# Patient Record
Sex: Female | Born: 1955 | Race: White | Hispanic: No | Marital: Married | Smoking: Current every day smoker
Health system: Southern US, Community
[De-identification: ages and names within clinical notes are randomized; demographics above are authoritative.]

## PROBLEM LIST (undated history)

## (undated) DIAGNOSIS — K219 Gastro-esophageal reflux disease without esophagitis: Secondary | ICD-10-CM

## (undated) DIAGNOSIS — F329 Major depressive disorder, single episode, unspecified: Secondary | ICD-10-CM

## (undated) DIAGNOSIS — F32A Depression, unspecified: Secondary | ICD-10-CM

## (undated) DIAGNOSIS — J449 Chronic obstructive pulmonary disease, unspecified: Secondary | ICD-10-CM

## (undated) DIAGNOSIS — M1990 Unspecified osteoarthritis, unspecified site: Secondary | ICD-10-CM

## (undated) DIAGNOSIS — M5030 Other cervical disc degeneration, unspecified cervical region: Secondary | ICD-10-CM

## (undated) DIAGNOSIS — M5432 Sciatica, left side: Secondary | ICD-10-CM

## (undated) DIAGNOSIS — I10 Essential (primary) hypertension: Secondary | ICD-10-CM

## (undated) DIAGNOSIS — R519 Headache, unspecified: Secondary | ICD-10-CM

## (undated) DIAGNOSIS — J309 Allergic rhinitis, unspecified: Secondary | ICD-10-CM

## (undated) DIAGNOSIS — E119 Type 2 diabetes mellitus without complications: Secondary | ICD-10-CM

## (undated) DIAGNOSIS — E785 Hyperlipidemia, unspecified: Secondary | ICD-10-CM

## (undated) DIAGNOSIS — Z720 Tobacco use: Secondary | ICD-10-CM

## (undated) HISTORY — DX: Unspecified osteoarthritis, unspecified site: M19.90

## (undated) HISTORY — DX: Type 2 diabetes mellitus without complications: E11.9

## (undated) HISTORY — DX: Gastro-esophageal reflux disease without esophagitis: K21.9

## (undated) HISTORY — DX: Major depressive disorder, single episode, unspecified: F32.9

## (undated) HISTORY — PX: EYE SURGERY: SHX253

## (undated) HISTORY — PX: CHOLECYSTECTOMY: SHX55

## (undated) HISTORY — PX: TONSILLECTOMY: SUR1361

## (undated) HISTORY — PX: CATARACT EXTRACTION: SUR2

## (undated) HISTORY — PX: CARPAL TUNNEL RELEASE: SHX101

## (undated) HISTORY — DX: Hyperlipidemia, unspecified: E78.5

## (undated) HISTORY — DX: Other cervical disc degeneration, unspecified cervical region: M50.30

## (undated) HISTORY — PX: OTHER SURGICAL HISTORY: SHX169

## (undated) HISTORY — DX: Sciatica, left side: M54.32

## (undated) HISTORY — DX: Depression, unspecified: F32.A

## (undated) HISTORY — DX: Essential (primary) hypertension: I10

## (undated) HISTORY — DX: Allergic rhinitis, unspecified: J30.9

## (undated) HISTORY — DX: Tobacco use: Z72.0

---

## 2000-02-05 HISTORY — PX: OTHER SURGICAL HISTORY: SHX169

## 2018-09-19 ENCOUNTER — Encounter: Payer: Self-pay | Admitting: *Deleted

## 2018-09-19 NOTE — Addendum Note (Signed)
Addended by: Eustace Moore on: 09/19/2018 04:42 PM   Modules accepted: Orders

## 2018-09-20 ENCOUNTER — Telehealth: Payer: Self-pay | Admitting: Cardiology

## 2018-09-20 ENCOUNTER — Ambulatory Visit: Payer: Managed Care, Other (non HMO) | Admitting: Cardiology

## 2018-09-20 ENCOUNTER — Encounter: Payer: Self-pay | Admitting: *Deleted

## 2018-09-20 ENCOUNTER — Encounter: Payer: Self-pay | Admitting: Cardiology

## 2018-09-20 VITALS — BP 127/74 | HR 85 | Ht 64.5 in | Wt 136.2 lb

## 2018-09-20 DIAGNOSIS — R002 Palpitations: Secondary | ICD-10-CM | POA: Diagnosis not present

## 2018-09-20 DIAGNOSIS — R079 Chest pain, unspecified: Secondary | ICD-10-CM

## 2018-09-20 NOTE — Progress Notes (Signed)
Clinical Summary Ms. Zarcone is a 64 y.o.female seen today as a new consult, referred by Dr Sherril Croon for chest pain and palpitations.   1. Chest pain - admission 07/2018 to [facility] with chest pain - negative cardiac enzymes, EKG without acute ischemic changes. CXR no acute process, did have some aortic atherosclerosis - 01/2017 echo LVEF >75%, possible diastolic dysfunction - 08/2018 echo LVEF 70-75%, no WMAs, abnormal diastolic function   - tightness in chest midchest, 7-8/10 in severity. +SOB. Some palpitations. Pain radiating to both shoulders - not positional. Pain lasted 12 hours constant - mild improvement with NG   - no recurrent tightness. Mild SOB at times  CAD risk factors: DM2, HTN, hyperlipidemia, +smoker just under 50 years, father MI in his early 56s, mother history of heart attacks and stents she thinks  - cannot run on treadmill due to leg pains.   2. Palpitations - 1-2 times per week. Lasts a few minutes. Can occur at any time. No other associated symptoms - coffee x 2-4 cups, occasional tea, no sodas, no energy drinks, no EtOH.   Past Medical History:  Diagnosis Date  . Allergic rhinitis   . Arthritis   . DDD (degenerative disc disease), cervical   . Depression   . GERD (gastroesophageal reflux disease)   . Hyperlipidemia   . Hypertension   . Sciatica of left side   . Tobacco abuse   . Type 2 diabetes mellitus (HCC)      No Known Allergies   Current Outpatient Medications  Medication Sig Dispense Refill  . aspirin 325 MG tablet Take 325 mg by mouth daily.    . Calcium-Magnesium-Zinc 333-133-5 MG TABS Take 1 tablet by mouth daily.    . dapagliflozin propanediol (FARXIGA) 10 MG TABS tablet Take 10 mg by mouth daily.    . diclofenac (CATAFLAM) 50 MG tablet Take 50 mg by mouth 2 (two) times daily.    . Dulaglutide (TRULICITY) 0.75 MG/0.5ML SOPN Inject 0.75 mg into the skin once a week.    . enalapril (VASOTEC) 5 MG tablet Take 5 mg by mouth  daily.    Marland Kitchen Fe Cbn-Fe Gluc-FA-B12-C-DSS (FERRALET 90 PO) Take 1 tablet by mouth daily.    . fenofibrate 160 MG tablet Take 160 mg by mouth daily.    Marland Kitchen gabapentin (NEURONTIN) 600 MG tablet Take 600 mg by mouth 2 (two) times daily.    Marland Kitchen glipiZIDE (GLUCOTROL) 10 MG tablet Take 5 mg by mouth daily before breakfast. & 10 mg in the evening    . hydrochlorothiazide (HYDRODIURIL) 25 MG tablet Take 25 mg by mouth daily.    . lansoprazole (PREVACID) 30 MG capsule Take 30 mg by mouth daily at 12 noon.    . metFORMIN (GLUCOPHAGE) 1000 MG tablet Take 1,000 mg by mouth 2 (two) times daily with a meal.    . niacin 500 MG tablet Take 1,000 mg by mouth at bedtime.     . sertraline (ZOLOFT) 100 MG tablet Take 100 mg by mouth daily.    . simvastatin (ZOCOR) 40 MG tablet Take 40 mg by mouth daily.    . vitamin E 400 UNIT capsule Take 800 Units by mouth daily.     No current facility-administered medications for this visit.         No Known Allergies    Family History  Problem Relation Age of Onset  . Heart disease Mother   . Hypertension Mother   . Kidney disease  Mother   . Hyperlipidemia Mother   . Heart disease Father   . Diabetes Father      Social History Ms. Mceuen reports that she has been smoking. She does not have any smokeless tobacco history on file. Ms. Felderman has no history on file for alcohol.   Review of Systems CONSTITUTIONAL: No weight loss, fever, chills, weakness or fatigue.  HEENT: Eyes: No visual loss, blurred vision, double vision or yellow sclerae.No hearing loss, sneezing, congestion, runny nose or sore throat.  SKIN: No rash or itching.  CARDIOVASCULAR: per hpi RESPIRATORY:per hpi GASTROINTESTINAL: No anorexia, nausea, vomiting or diarrhea. No abdominal pain or blood.  GENITOURINARY: No burning on urination, no polyuria NEUROLOGICAL: No headache, dizziness, syncope, paralysis, ataxia, numbness or tingling in the extremities. No change in bowel or bladder control.   MUSCULOSKELETAL: No muscle, back pain, joint pain or stiffness.  LYMPHATICS: No enlarged nodes. No history of splenectomy.  PSYCHIATRIC: No history of depression or anxiety.  ENDOCRINOLOGIC: No reports of sweating, cold or heat intolerance. No polyuria or polydipsia.  Marland Kitchen   Physical Examination Vitals:   09/20/18 1320 09/20/18 1326  BP: 101/64 127/74  Pulse: 83 85  SpO2: 96% 95%   Vitals:   09/20/18 1320  Weight: 136 lb 3.2 oz (61.8 kg)  Height: 5' 4.5" (1.638 m)    Gen: resting comfortably, no acute distress HEENT: no scleral icterus, pupils equal round and reactive, no palptable cervical adenopathy,  CV: RRR, 2/6 systolic murmur rusb, no jvd Resp: Clear to auscultation bilaterally GI: abdomen is soft, non-tender, non-distended, normal bowel sounds, no hepatosplenomegaly MSK: extremities are warm, no edema.  Skin: warm, no rash Neuro:  no focal deficits Psych: appropriate affect     Assessment and Plan  1. Chest pain - mixed symptoms in description, several CAD risk factors as well as some aortic atherosclerosis by imaging - plan for a lexiscan to further evaluate - continue ASA for now, if negative stress test would not be an indication for further use. Would lower to 81mg  daily if she continues long term   2. Palpitations - complete ischemic workup as outlined above first. She is to wean caffeien, if ongoing symptoms plan for a 1 week event monitor   F/u 2 months      Antoine Poche, M.D

## 2018-09-20 NOTE — Patient Instructions (Signed)
Your physician recommends that you schedule a follow-up appointment in: 2 MONTHS WITH DR [facility]  Your physician recommends that you continue on your current medications as directed. Please refer to the Current Medication list given to you today.  Your physician has requested that you have a lexiscan myoview. For further information please visit [email or web address] Please follow instruction sheet, as given.  Thank you for choosing [facility]!!

## 2018-09-20 NOTE — Telephone Encounter (Signed)
Pre-cert Verification for the following procedure   Lexiscan myoview scheduled for 09-29-2018 at [facility]

## 2018-09-29 ENCOUNTER — Encounter (HOSPITAL_BASED_OUTPATIENT_CLINIC_OR_DEPARTMENT_OTHER)
Admission: RE | Admit: 2018-09-29 | Discharge: 2018-09-29 | Disposition: A | Discharge status: Home / Self Care | Payer: Managed Care, Other (non HMO) | Source: Ambulatory Visit | Attending: Cardiology | Admitting: Cardiology

## 2018-09-29 ENCOUNTER — Encounter (HOSPITAL_COMMUNITY)
Admission: RE | Admit: 2018-09-29 | Discharge: 2018-09-29 | Disposition: A | Discharge status: Home / Self Care | Payer: Managed Care, Other (non HMO) | Source: Ambulatory Visit | Attending: Cardiology | Admitting: Cardiology

## 2018-09-29 ENCOUNTER — Encounter (HOSPITAL_COMMUNITY): Payer: Self-pay

## 2018-09-29 DIAGNOSIS — R079 Chest pain, unspecified: Secondary | ICD-10-CM | POA: Diagnosis not present

## 2018-09-29 LAB — NM MYOCAR MULTI W/SPECT W/WALL MOTION / EF
LV dias vol: 79 mL (ref 46–106)
LV sys vol: 25 mL
Peak HR: 100 {beats}/min
RATE: 0.21
Rest HR: 64 {beats}/min
SDS: 1
SRS: 0
SSS: 1
TID: 1.06

## 2018-09-29 MED ORDER — REGADENOSON 0.4 MG/5ML IV SOLN
INTRAVENOUS | Status: AC
  Administered 2018-09-29: 0.4 mg via INTRAVENOUS
  Filled 2018-09-29: qty 5

## 2018-09-29 MED ORDER — SODIUM CHLORIDE 0.9% FLUSH
INTRAVENOUS | Status: AC
  Administered 2018-09-29: 10 mL via INTRAVENOUS
  Filled 2018-09-29: qty 10

## 2018-09-29 MED ORDER — TECHNETIUM TC 99M TETROFOSMIN IV KIT
10.0000 | PACK | Freq: Once | INTRAVENOUS | Status: AC | PRN
  Administered 2018-09-29: 9.6 via INTRAVENOUS

## 2018-09-29 MED ORDER — TECHNETIUM TC 99M TETROFOSMIN IV KIT
30.0000 | PACK | Freq: Once | INTRAVENOUS | Status: AC | PRN
  Administered 2018-09-29: 32.3 via INTRAVENOUS

## 2018-10-04 ENCOUNTER — Telehealth: Payer: Self-pay | Admitting: *Deleted

## 2018-10-04 NOTE — Telephone Encounter (Signed)
-----   Message from Antoine Poche, MD sent at 10/02/2018  4:25 PM EST ----- Stress test overall looks good, no evidence of significant blockages. Ok to stop aspirin  Dominga Ferry MD

## 2018-10-04 NOTE — Telephone Encounter (Signed)
Pt voiced understanding - updated medication list  - routed to pcp  

## 2018-11-21 ENCOUNTER — Ambulatory Visit: Payer: Managed Care, Other (non HMO) | Admitting: Cardiology

## 2018-12-12 ENCOUNTER — Telehealth: Payer: Self-pay | Admitting: *Deleted

## 2018-12-12 NOTE — Telephone Encounter (Signed)
   Primary Cardiologist:  No primary care provider on file.   Patient contacted.  History reviewed.  No symptoms to suggest any unstable cardiac conditions.  Based on discussion, with current pandemic situation, we will be postponing this appointment for Eddie Dibbles with a plan for f/u June 2020 or sooner if feasible/necessary.  If symptoms change, she has been instructed to contact our office.  Patient said she isn't having any issues at this time and will call back and schedule an appointment if she has anymore problems.  Thalia Bloodgood, RN  12/12/2018 9:37 AM         .

## 2018-12-15 ENCOUNTER — Ambulatory Visit: Payer: Managed Care, Other (non HMO) | Admitting: Cardiology

## 2020-07-14 ENCOUNTER — Other Ambulatory Visit: Payer: Self-pay | Admitting: Neurosurgery

## 2020-07-14 DIAGNOSIS — M5416 Radiculopathy, lumbar region: Secondary | ICD-10-CM

## 2020-07-22 ENCOUNTER — Ambulatory Visit
Admission: RE | Admit: 2020-07-22 | Discharge: 2020-07-22 | Disposition: A | Discharge status: Home / Self Care | Payer: Managed Care, Other (non HMO) | Source: Ambulatory Visit | Attending: Neurosurgery | Admitting: Neurosurgery

## 2020-07-22 ENCOUNTER — Other Ambulatory Visit: Payer: Self-pay

## 2020-07-22 DIAGNOSIS — M5416 Radiculopathy, lumbar region: Secondary | ICD-10-CM

## 2021-08-21 IMAGING — CT CT L SPINE W/O CM
1 of 7 series · 5 of 14 positions shown, 7 images · non-contrast
Comparison: 05/15/2020

CLINICAL DATA: Low back pain with radiculopathy affecting both
lower extremities, worsening. Symptoms began 4 months ago

EXAM:
CT LUMBAR SPINE WITHOUT CONTRAST
TECHNIQUE: Multidetector CT imaging of the lumbar spine was performed without
intravenous contrast administration. Multiplanar CT image
reconstructions were also generated.

[Series 3: l spine soft · axial · 0.29mm/px · z∈[-253,-109]mm · 5 of 72 slices shown, 7 images]
[im 12/72  soft-tissue]
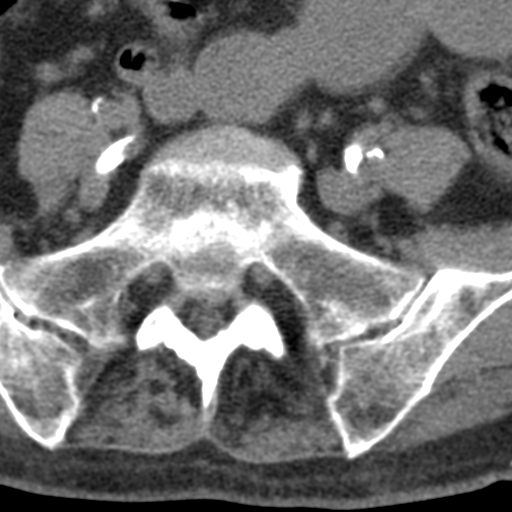
[im 12/72  bone]
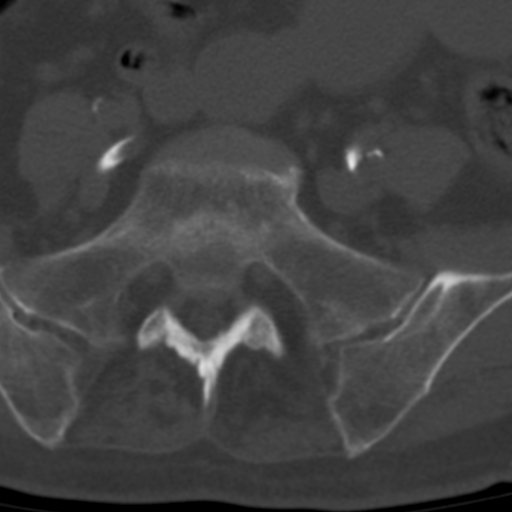
[im 24/72  bone]
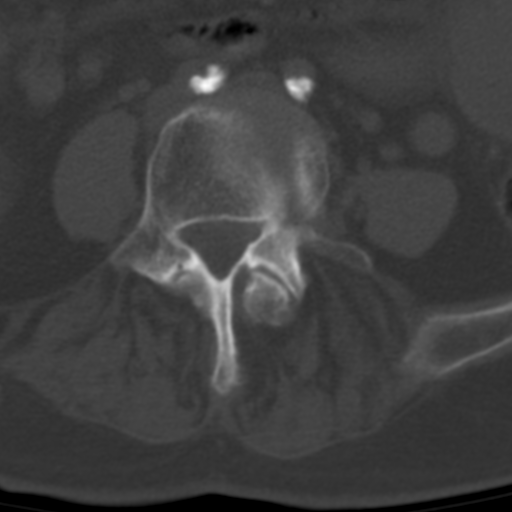
[im 36/72  bone]
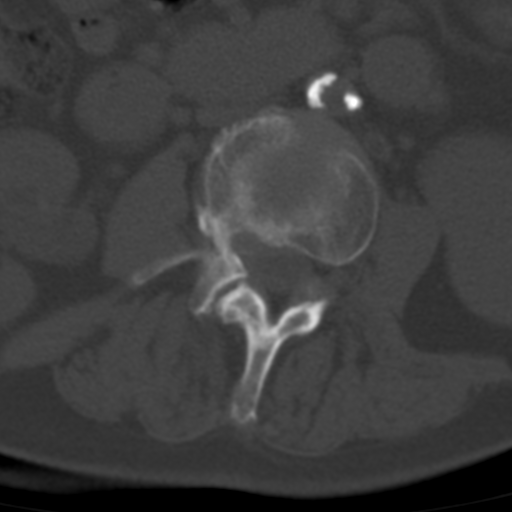
[im 48/72  bone]
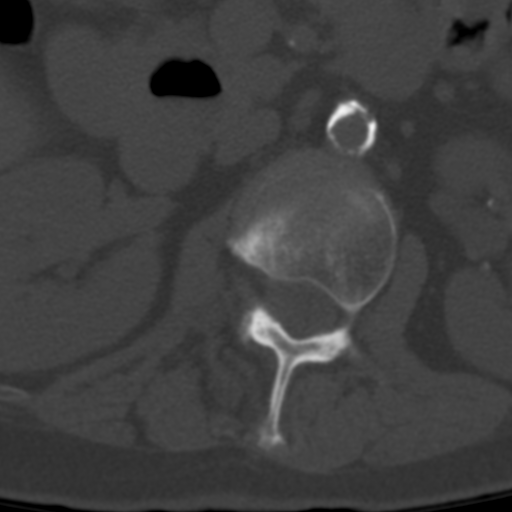
[im 60/72  soft-tissue]
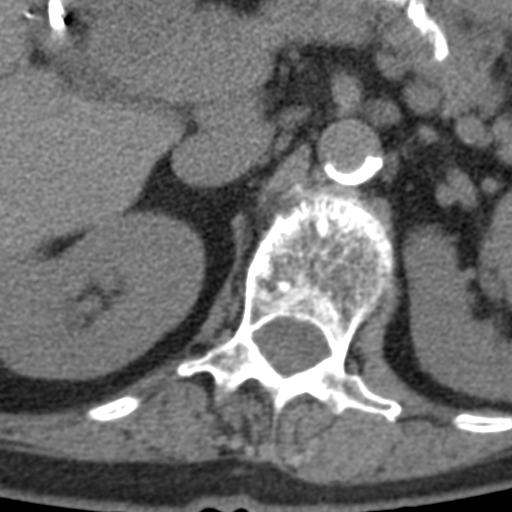
[im 60/72  bone]
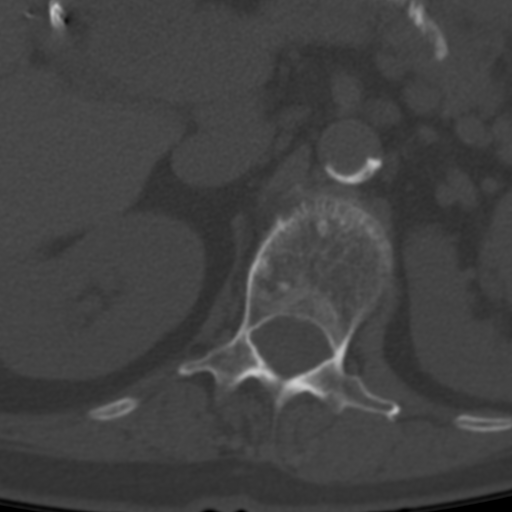

[5 of 14 positions shown; findings below may reference images not displayed]

FINDINGS: Segmentation: 5 lumbar type vertebrae

Alignment: Levoscoliosis centered at L2-3. Cobb angle would be
better measured on standing radiograph. Rightward L5-S1 translation.

Vertebrae: No acute fracture or focal pathologic process.

Paraspinal and other soft tissues: Diffuse atheromatous
calcification.

Disc levels:

T12- L1: Disc narrowing and endplate spurring. No neural
compression.

L1-L2: Advanced rightward disc space narrowing with right
preferential bulging. Mild to moderate right foraminal narrowing,
mild by MRI. Patent spinal canal.

L2-L3: Mild disc narrowing and left preferential bulging. No neural
compression

L3-L4: Disc narrowing and bulging with endplate and facet spurring
asymmetric to the right where there is moderate or advanced
foraminal impingement. Mild to moderate spinal stenosis.

L4-L5: Facet degeneration with asymmetric left-sided spurring. Disc
narrowing and bulging. Moderate left foraminal narrowing.
Mild-to-moderate spinal stenosis.

L5-S1:Asymmetric leftward disc narrowing and endplate/facet
spurring. Moderate left foraminal narrowing.
IMPRESSION: 1. Multilevel disc and facet degeneration with prominent
levoscoliosis.
2. L1-2 and L3-4 right foraminal stenosis.
3. L4-5 and L5-S1 left foraminal stenosis.
4. Mild to moderate spinal stenosis at L3-4.

## 2022-12-30 ENCOUNTER — Ambulatory Visit (HOSPITAL_COMMUNITY): Payer: Self-pay | Admitting: Emergency Medicine

## 2022-12-30 DIAGNOSIS — G8929 Other chronic pain: Secondary | ICD-10-CM

## 2022-12-30 NOTE — H&P (Signed)
TOTAL HIP ADMISSION H&P  Patient is admitted for left total hip arthroplasty.  Subjective:  Chief Complaint: left hip pain  HPI: Kylie Knight, 67 y.o. female, has a history of pain and functional disability in the left hip(s) due to arthritis and patient has failed non-surgical conservative treatments for greater than 12 weeks to include NSAID's and/or analgesics, corticosteriod injections, supervised PT with diminished ADL's post treatment, use of assistive devices, and activity modification.  Onset of symptoms was gradual starting 2 years ago with gradually worsening course since that time.The patient noted no past surgery on the bilaterally hip(s).  Patient currently rates pain in the left hip at 10 out of 10 with activity. Patient has night pain, worsening of pain with activity and weight bearing, pain that interfers with activities of daily living, and pain with passive range of motion. Patient has evidence of periarticular osteophytes and joint space narrowing by imaging studies. This condition presents safety issues increasing the risk of falls.  There is no current active infection.  There are no problems to display for this patient.  Past Medical History:  Diagnosis Date   Allergic rhinitis    Arthritis    DDD (degenerative disc disease), cervical    Depression    GERD (gastroesophageal reflux disease)    Hyperlipidemia    Hypertension    Sciatica of left side    Tobacco abuse    Type 2 diabetes mellitus (HCC)     Past Surgical History:  Procedure Laterality Date   CARPAL TUNNEL RELEASE Bilateral    CATARACT EXTRACTION     CESAREAN SECTION     x's 2   CHOLECYSTECTOMY     REVISION TOTAL DISC ARTHROPLASTY, CERVICAL, SINGLE     TONSILLECTOMY      Current Outpatient Medications  Medication Sig Dispense Refill Last Dose   Calcium-Magnesium-Zinc 333-133-5 MG TABS Take 1 tablet by mouth daily.      Cholecalciferol (VITAMIN D3) 125 MCG (5000 UT) CAPS Take 1 capsule by  mouth daily.      dapagliflozin propanediol (FARXIGA) 10 MG TABS tablet Take 10 mg by mouth daily.      Dulaglutide (TRULICITY) 0.75 MG/0.5ML SOPN Inject 0.75 mg into the skin once a week.      enalapril (VASOTEC) 5 MG tablet Take 5 mg by mouth daily.      fenofibrate 160 MG tablet Take 160 mg by mouth daily.      gabapentin (NEURONTIN) 600 MG tablet Take 600 mg by mouth 2 (two) times daily.      glipiZIDE (GLUCOTROL) 10 MG tablet Take 5 mg by mouth daily before breakfast. & 10 mg in the evening      hydrochlorothiazide (HYDRODIURIL) 25 MG tablet Take 25 mg by mouth daily.      lansoprazole (PREVACID) 30 MG capsule Take 30 mg by mouth daily at 12 noon.      loratadine (CLARITIN) 10 MG tablet Take 10 mg by mouth daily.      metFORMIN (GLUCOPHAGE) 1000 MG tablet Take 1,000 mg by mouth 2 (two) times daily with a meal.      Multiple Vitamin (MULTIVITAMIN) tablet Take 1 tablet by mouth daily.      niacin 500 MG tablet Take 1,000 mg by mouth at bedtime.       sertraline (ZOLOFT) 100 MG tablet Take 100 mg by mouth daily.      simvastatin (ZOCOR) 40 MG tablet Take 40 mg by mouth daily.      vitamin  E 400 UNIT capsule Take 400 Units by mouth daily.       No current facility-administered medications for this visit.   No Known Allergies  Social History   Tobacco Use   Smoking status: Every Day    Types: Cigarettes    Start date: 12/22/1969   Smokeless tobacco: Never  Substance Use Topics   Alcohol use: Not on file    Family History  Problem Relation Age of Onset   Heart disease Mother    Hypertension Mother    Kidney disease Mother    Hyperlipidemia Mother    Heart disease Father    Diabetes Father      Review of Systems  Musculoskeletal:  Positive for arthralgias.  All other systems reviewed and are negative.   Objective:  Physical Exam Constitutional:      General: She is not in acute distress.    Appearance: Normal appearance. She is not ill-appearing.  HENT:     Head:  Normocephalic and atraumatic.     Right Ear: External ear normal.     Left Ear: External ear normal.     Nose: Nose normal.     Mouth/Throat:     Mouth: Mucous membranes are moist.     Pharynx: Oropharynx is clear.  Eyes:     Extraocular Movements: Extraocular movements intact.     Conjunctiva/sclera: Conjunctivae normal.  Cardiovascular:     Rate and Rhythm: Normal rate and regular rhythm.     Pulses: Normal pulses.     Heart sounds: Normal heart sounds.  Pulmonary:     Effort: Pulmonary effort is normal.     Breath sounds: Normal breath sounds.  Abdominal:     General: Bowel sounds are normal.     Palpations: Abdomen is soft.     Tenderness: There is no abdominal tenderness.  Musculoskeletal:        General: Tenderness present.     Cervical back: Normal range of motion and neck supple.     Comments: LEFT HIP: TTP over lateral aspect and groin area.  Painful ROM.  No lesions on area of chief complaint.  BLE appear grossly neurovascularly intact.  Mild decreased strength of LLE.  Mildly antalgic gait.  Skin:    General: Skin is warm and dry.  Neurological:     Mental Status: She is alert and oriented to person, place, and time. Mental status is at baseline.  Psychiatric:        Mood and Affect: Mood normal.        Behavior: Behavior normal.     Vital signs in last 24 hours: @VSRANGES @  Labs:   Estimated body mass index is 23.02 kg/m as calculated from the following:   Height as of 09/20/18: 5' 4.5" (1.638 m).   Weight as of 09/20/18: 61.8 kg.   Imaging Review Plain radiographs demonstrate severe degenerative joint disease of the left hip(s). The bone quality appears to be good for age and reported activity level.      Assessment/Plan:  End stage arthritis, left hip(s)  The patient history, physical examination, clinical judgement of the provider and imaging studies are consistent with end stage degenerative joint disease of the left hip(s) and total hip  arthroplasty is deemed medically necessary. The treatment options including medical management, injection therapy, arthroscopy and arthroplasty were discussed at length. The risks and benefits of total hip arthroplasty were presented and reviewed. The risks due to aseptic loosening, infection, stiffness, dislocation/subluxation,  thromboembolic complications  and other imponderables were discussed.  The patient acknowledged the explanation, agreed to proceed with the plan and consent was signed. Patient is being admitted for inpatient treatment for surgery, pain control, PT, OT, prophylactic antibiotics, VTE prophylaxis, progressive ambulation and ADL's and discharge planning.The patient is planning to be discharged  home with outpatient PT.

## 2022-12-30 NOTE — H&P (View-Only) (Signed)
TOTAL HIP ADMISSION H&P  Patient is admitted for left total hip arthroplasty.  Subjective:  Chief Complaint: left hip pain  HPI: Kylie Knight, 67 y.o. female, has a history of pain and functional disability in the left hip(s) due to arthritis and patient has failed non-surgical conservative treatments for greater than 12 weeks to include NSAID's and/or analgesics, corticosteriod injections, supervised PT with diminished ADL's post treatment, use of assistive devices, and activity modification.  Onset of symptoms was gradual starting 2 years ago with gradually worsening course since that time.The patient noted no past surgery on the bilaterally hip(s).  Patient currently rates pain in the left hip at 10 out of 10 with activity. Patient has night pain, worsening of pain with activity and weight bearing, pain that interfers with activities of daily living, and pain with passive range of motion. Patient has evidence of periarticular osteophytes and joint space narrowing by imaging studies. This condition presents safety issues increasing the risk of falls.  There is no current active infection.  There are no problems to display for this patient.  Past Medical History:  Diagnosis Date   Allergic rhinitis    Arthritis    DDD (degenerative disc disease), cervical    Depression    GERD (gastroesophageal reflux disease)    Hyperlipidemia    Hypertension    Sciatica of left side    Tobacco abuse    Type 2 diabetes mellitus (HCC)     Past Surgical History:  Procedure Laterality Date   CARPAL TUNNEL RELEASE Bilateral    CATARACT EXTRACTION     CESAREAN SECTION     x's 2   CHOLECYSTECTOMY     REVISION TOTAL DISC ARTHROPLASTY, CERVICAL, SINGLE     TONSILLECTOMY      Current Outpatient Medications  Medication Sig Dispense Refill Last Dose   Calcium-Magnesium-Zinc 333-133-5 MG TABS Take 1 tablet by mouth daily.      Cholecalciferol (VITAMIN D3) 125 MCG (5000 UT) CAPS Take 1 capsule by  mouth daily.      dapagliflozin propanediol (FARXIGA) 10 MG TABS tablet Take 10 mg by mouth daily.      Dulaglutide (TRULICITY) 0.75 MG/0.5ML SOPN Inject 0.75 mg into the skin once a week.      enalapril (VASOTEC) 5 MG tablet Take 5 mg by mouth daily.      fenofibrate 160 MG tablet Take 160 mg by mouth daily.      gabapentin (NEURONTIN) 600 MG tablet Take 600 mg by mouth 2 (two) times daily.      glipiZIDE (GLUCOTROL) 10 MG tablet Take 5 mg by mouth daily before breakfast. & 10 mg in the evening      hydrochlorothiazide (HYDRODIURIL) 25 MG tablet Take 25 mg by mouth daily.      lansoprazole (PREVACID) 30 MG capsule Take 30 mg by mouth daily at 12 noon.      loratadine (CLARITIN) 10 MG tablet Take 10 mg by mouth daily.      metFORMIN (GLUCOPHAGE) 1000 MG tablet Take 1,000 mg by mouth 2 (two) times daily with a meal.      Multiple Vitamin (MULTIVITAMIN) tablet Take 1 tablet by mouth daily.      niacin 500 MG tablet Take 1,000 mg by mouth at bedtime.       sertraline (ZOLOFT) 100 MG tablet Take 100 mg by mouth daily.      simvastatin (ZOCOR) 40 MG tablet Take 40 mg by mouth daily.      vitamin   E 400 UNIT capsule Take 400 Units by mouth daily.       No current facility-administered medications for this visit.   No Known Allergies  Social History   Tobacco Use   Smoking status: Every Day    Types: Cigarettes    Start date: 12/22/1969   Smokeless tobacco: Never  Substance Use Topics   Alcohol use: Not on file    Family History  Problem Relation Age of Onset   Heart disease Mother    Hypertension Mother    Kidney disease Mother    Hyperlipidemia Mother    Heart disease Father    Diabetes Father      Review of Systems  Musculoskeletal:  Positive for arthralgias.  All other systems reviewed and are negative.   Objective:  Physical Exam Constitutional:      General: She is not in acute distress.    Appearance: Normal appearance. She is not ill-appearing.  HENT:     Head:  Normocephalic and atraumatic.     Right Ear: External ear normal.     Left Ear: External ear normal.     Nose: Nose normal.     Mouth/Throat:     Mouth: Mucous membranes are moist.     Pharynx: Oropharynx is clear.  Eyes:     Extraocular Movements: Extraocular movements intact.     Conjunctiva/sclera: Conjunctivae normal.  Cardiovascular:     Rate and Rhythm: Normal rate and regular rhythm.     Pulses: Normal pulses.     Heart sounds: Normal heart sounds.  Pulmonary:     Effort: Pulmonary effort is normal.     Breath sounds: Normal breath sounds.  Abdominal:     General: Bowel sounds are normal.     Palpations: Abdomen is soft.     Tenderness: There is no abdominal tenderness.  Musculoskeletal:        General: Tenderness present.     Cervical back: Normal range of motion and neck supple.     Comments: LEFT HIP: TTP over lateral aspect and groin area.  Painful ROM.  No lesions on area of chief complaint.  BLE appear grossly neurovascularly intact.  Mild decreased strength of LLE.  Mildly antalgic gait.  Skin:    General: Skin is warm and dry.  Neurological:     Mental Status: She is alert and oriented to person, place, and time. Mental status is at baseline.  Psychiatric:        Mood and Affect: Mood normal.        Behavior: Behavior normal.     Vital signs in last 24 hours: @VSRANGES@  Labs:   Estimated body mass index is 23.02 kg/m as calculated from the following:   Height as of 09/20/18: 5' 4.5" (1.638 m).   Weight as of 09/20/18: 61.8 kg.   Imaging Review Plain radiographs demonstrate severe degenerative joint disease of the left hip(s). The bone quality appears to be good for age and reported activity level.      Assessment/Plan:  End stage arthritis, left hip(s)  The patient history, physical examination, clinical judgement of the provider and imaging studies are consistent with end stage degenerative joint disease of the left hip(s) and total hip  arthroplasty is deemed medically necessary. The treatment options including medical management, injection therapy, arthroscopy and arthroplasty were discussed at length. The risks and benefits of total hip arthroplasty were presented and reviewed. The risks due to aseptic loosening, infection, stiffness, dislocation/subluxation,  thromboembolic complications   and other imponderables were discussed.  The patient acknowledged the explanation, agreed to proceed with the plan and consent was signed. Patient is being admitted for inpatient treatment for surgery, pain control, PT, OT, prophylactic antibiotics, VTE prophylaxis, progressive ambulation and ADL's and discharge planning.The patient is planning to be discharged  home with outpatient PT.   

## 2023-01-13 NOTE — Patient Instructions (Signed)
SURGICAL WAITING ROOM VISITATION Patients having surgery or a procedure may have no more than 2 support people in the waiting area - these visitors may rotate in the visitor waiting room.   Due to an increase in RSV and influenza rates and associated hospitalizations, children ages 7 and under may not visit patients in [facility] hospitals. If the patient needs to stay at the hospital during part of their recovery, the visitor guidelines for inpatient rooms apply.  PRE-OP VISITATION  Pre-op nurse will coordinate an appropriate time for 1 support person to accompany the patient in pre-op.  This support person may not rotate.  This visitor will be contacted when the time is appropriate for the visitor to come back in the pre-op area.  Please refer to the [facility] website for the visitor guidelines for Inpatients (after your surgery is over and you are in a regular room).  You are not required to quarantine at this time prior to your surgery. However, you must do this: Hand Hygiene often Do NOT share personal items Notify your provider if you are in close contact with someone who has COVID or you develop fever 100.4 or greater, new onset of sneezing, cough, sore throat, shortness of breath or body aches.  If you test positive for Covid or have been in contact with anyone that has tested positive in the last 10 days please notify you surgeon.    Your procedure is scheduled on:  Wednesday  Jan 26, 2023  Report to [facility] Main Entrance: Leota Jacobsen entrance where the [employer] is available.   Report to admitting at: 07:45    AM  +++++Call this number if you have any questions or problems the morning of surgery [phone]  Do not eat food after Midnight the night prior to your surgery/procedure.  After Midnight you may have the following liquids until  07:15  AM  DAY OF SURGERY  Clear Liquid Diet Water Black Coffee (sugar ok, NO MILK/CREAM OR CREAMERS)  Tea (sugar ok, NO  MILK/CREAM OR CREAMERS) regular and decaf                             Plain Jell-O  with no fruit (NO RED)                                           Fruit ices (not with fruit pulp, NO RED)                                     Popsicles (NO RED)                                                                  Juice: apple, WHITE grape, WHITE cranberry Sports drinks like Gatorade or Powerade (NO RED)                     The day of surgery:  Drink ONE (1) Pre-Surgery G2 at   07:15  AM the morning of surgery.  Drink in one sitting. Do not sip.  This drink was given to you during your hospital pre-op appointment visit. Nothing else to drink after completing the Pre-Surgery  G2 : No candy, chewing gum or throat lozenges.    FOLLOW  ANY ADDITIONAL PRE OP INSTRUCTIONS YOU RECEIVED FROM YOUR SURGEON'S OFFICE!!!   Oral Hygiene is also important to reduce your risk of infection.        Remember - BRUSH YOUR TEETH THE MORNING OF SURGERY WITH YOUR REGULAR TOOTHPASTE  Do NOT smoke after Midnight the night before surgery.  DIABETIC Medication:  TRULICITY Injection- Do not inject for 7 days prior to your surgery;  Last injection will be on :  FARXIGA: Hold 72 hours before your surgery. METFORMIN: DO NOT Take the day of your surgery, GLIPIZIDE:  DO NOT Take the day of your surgery.  Take ONLY these medicines the morning of surgery with A SIP OF WATER: fenofibrate, loratadine (Claritin), sertraline (Zoloft), gabapentin.                     You may not have any metal on your body including hair pins, jewelry, and body piercing  Do not wear make-up, lotions, powders, perfumes  or deodorant  Do not wear nail polish including gel and S&S, artificial / acrylic nails, or any other type of covering on natural nails including finger and toenails. If you have artificial nails, gel coating, etc., that needs to be removed by a nail salon, Please have this removed prior to surgery. Not doing so may mean that  your surgery could be cancelled or delayed if the Surgeon or anesthesia staff feels like they are unable to monitor you safely.   Do not shave 48 hours prior to surgery to avoid nicks in your skin which may contribute to postoperative infections.   Contacts, Hearing Aids, dentures or bridgework may not be worn into surgery. DENTURES WILL BE REMOVED PRIOR TO SURGERY PLEASE DO NOT APPLY "Poly grip" OR ADHESIVES!!!   Patients discharged on the day of surgery will not be allowed to drive home.  Someone NEEDS to stay with you for the first 24 hours after anesthesia.  Do not bring your home medications to the hospital. The Pharmacy will dispense medications listed on your medication list to you during your admission in the Hospital.   Please read over the following fact sheets you were given: IF YOU HAVE QUESTIONS ABOUT YOUR PRE-OP INSTRUCTIONS, PLEASE CALL [phone].   +++++++ PLEASE FOLLOW THE ATTACHED INFORMATION REGARDING SHOWERING / BATHING SCHEDULE  PRIOR TO YOUR SURGERY. Start this schedule on :  Saturday  Jan 22, 2023    ON THE DAY OF SURGERY : Do not apply any lotions/deodorants the morning of surgery.  Please wear clean clothes to the hospital/surgery center.    FAILURE TO FOLLOW THESE INSTRUCTIONS MAY RESULT IN THE CANCELLATION OF YOUR SURGERY  PATIENT SIGNATURE_________________________________  NURSE SIGNATURE__________________________________  ________________________________________________________________________       Rogelia Mire    An incentive spirometer is a tool that can help keep your lungs clear and active. This tool measures how well you are filling your lungs with each breath. Taking long deep breaths may help reverse or decrease the chance of developing breathing (pulmonary) problems (especially infection) following: A long period of time when you are unable to move or be active. BEFORE THE PROCEDURE  If the spirometer includes an indicator to  show your best effort, your nurse or respiratory therapist will  set it to a desired goal. If possible, sit up straight or lean slightly forward. Try not to slouch. Hold the incentive spirometer in an upright position. INSTRUCTIONS FOR USE  Sit on the edge of your bed if possible, or sit up as far as you can in bed or on a chair. Hold the incentive spirometer in an upright position. Breathe out normally. Place the mouthpiece in your mouth and seal your lips tightly around it. Breathe in slowly and as deeply as possible, raising the piston or the ball toward the top of the column. Hold your breath for 3-5 seconds or for as long as possible. Allow the piston or ball to fall to the bottom of the column. Remove the mouthpiece from your mouth and breathe out normally. Rest for a few seconds and repeat Steps 1 through 7 at least 10 times every 1-2 hours when you are awake. Take your time and take a few normal breaths between deep breaths. The spirometer may include an indicator to show your best effort. Use the indicator as a goal to work toward during each repetition. After each set of 10 deep breaths, practice coughing to be sure your lungs are clear. If you have an incision (the cut made at the time of surgery), support your incision when coughing by placing a pillow or rolled up towels firmly against it. Once you are able to get out of bed, walk around indoors and cough well. You may stop using the incentive spirometer when instructed by your caregiver.  RISKS AND COMPLICATIONS Take your time so you do not get dizzy or light-headed. If you are in pain, you may need to take or ask for pain medication before doing incentive spirometry. It is harder to take a deep breath if you are having pain. AFTER USE Rest and breathe slowly and easily. It can be helpful to keep track of a log of your progress. Your caregiver can provide you with a simple table to help with this. If you are using the spirometer at  home, follow these instructions: SEEK MEDICAL CARE IF:  You are having difficultly using the spirometer. You have trouble using the spirometer as often as instructed. Your pain medication is not giving enough relief while using the spirometer. You develop fever of 100.5 F (38.1 C) or higher.                                                                                                    SEEK IMMEDIATE MEDICAL CARE IF:  You cough up bloody sputum that had not been present before. You develop fever of 102 F (38.9 C) or greater. You develop worsening pain at or near the incision site. MAKE SURE YOU:  Understand these instructions. Will watch your condition. Will get help right away if you are not doing well or get worse. Document Released: 01/03/2007 Document Revised: 11/15/2011 Document Reviewed: 03/06/2007 [facility] Patient Information 2014 [place], [place].     WHAT IS A BLOOD TRANSFUSION? Blood Transfusion Information  A transfusion is the replacement of blood or some of its parts.  Blood is made up of multiple cells which provide different functions. Red blood cells carry oxygen and are used for blood loss replacement. White blood cells fight against infection. Platelets control bleeding. Plasma helps clot blood. Other blood products are available for specialized needs, such as hemophilia or other clotting disorders. BEFORE THE TRANSFUSION  Who gives blood for transfusions?  Healthy volunteers who are fully evaluated to make sure their blood is safe. This is blood bank blood. Transfusion therapy is the safest it has ever been in the practice of medicine. Before blood is taken from a donor, a complete history is taken to make sure that person has no history of diseases nor engages in risky social behavior (examples are intravenous drug use or sexual activity with multiple partners). The donor's travel history is screened to minimize risk of transmitting infections, such as malaria.  The donated blood is tested for signs of infectious diseases, such as HIV and hepatitis. The blood is then tested to be sure it is compatible with you in order to minimize the chance of a transfusion reaction. If you or a relative donates blood, this is often done in anticipation of surgery and is not appropriate for emergency situations. It takes many days to process the donated blood. RISKS AND COMPLICATIONS Although transfusion therapy is very safe and saves many lives, the main dangers of transfusion include:  Getting an infectious disease. Developing a transfusion reaction. This is an allergic reaction to something in the blood you were given. Every precaution is taken to prevent this. The decision to have a blood transfusion has been considered carefully by your caregiver before blood is given. Blood is not given unless the benefits outweigh the risks. AFTER THE TRANSFUSION Right after receiving a blood transfusion, you will usually feel much better and more energetic. This is especially true if your red blood cells have gotten low (anemic). The transfusion raises the level of the red blood cells which carry oxygen, and this usually causes an energy increase. The nurse administering the transfusion will monitor you carefully for complications. HOME CARE INSTRUCTIONS  No special instructions are needed after a transfusion. You may find your energy is better. Speak with your caregiver about any limitations on activity for underlying diseases you may have. SEEK MEDICAL CARE IF:  Your condition is not improving after your transfusion. You develop redness or irritation at the intravenous (IV) site. SEEK IMMEDIATE MEDICAL CARE IF:  Any of the following symptoms occur over the next 12 hours: Shaking chills. You have a temperature by mouth above 102 F (38.9 C), not controlled by medicine. Chest, back, or muscle pain. People around you feel you are not acting correctly or are confused. Shortness  of breath or difficulty breathing. Dizziness and fainting. You get a rash or develop hives. You have a decrease in urine output. Your urine turns a dark color or changes to pink, red, or brown. Any of the following symptoms occur over the next 10 days: You have a temperature by mouth above 102 F (38.9 C), not controlled by medicine. Shortness of breath. Weakness after normal activity. The white part of the eye turns yellow (jaundice). You have a decrease in the amount of urine or are urinating less often. Your urine turns a dark color or changes to pink, red, or brown. Document Released: 08/20/2000 Document Revised: 11/15/2011 Document Reviewed: 04/08/2008 [facility] Patient Information 2014 [place], [place].  _______________________________________________________________________

## 2023-01-13 NOTE — Progress Notes (Addendum)
COVID Vaccine received:  []  No [x]  Yes Date of any COVID positive Test in last 90 days:  None  PCP - Doreen Beam, MD , sees Orvilla Cornwall, NP Cardiologist - Nanetta Batty, MD  (LOV 09-20-2018 for CP, palps) Pain Mgmt/ Neurosurgery-  Celedonio Miyamoto, PA-C at [facility] [phone]  Chest x-ray -  EKG -  07-31-2018  Epic  will repeat at PST Stress Test - Myoview 09-29-2018  Epic ECHO - 08-14-2018  Epic  for palpitations Cardiac Cath -   PCR screen: [x]  Ordered & Completed           []   No Order but Needs PROFEND           []   N/A for this surgery  Surgery Plan:  [x]  Ambulatory                            []  Outpatient in bed                            []  Admit  Anesthesia:    []  General  [x]  Spinal                           []   Choice []   MAC  Pacemaker / ICD device [x]  No []  Yes   Spinal Cord Stimulator:[]  No [x]  Yes  Patient instructed to bring remote on DOS.      History of Sleep Apnea? [x]  No []  Yes   CPAP used?- [x]  No []  Yes    Does the patient monitor blood sugar?          []  No [x]  Yes  []  N/A Last A1c- 12-22-2022 ? 6.5 Patient has: []  NO Hx DM   []  Pre-DM                 []  DM1  [x]   DM2 Does patient have a [employer] or Dexacom? [x]  No []  Yes   Fasting Blood Sugar Ranges- 115-130 Checks Blood Sugar _2-3  times a day  GLP1 agonist / usual dose - Truliciy (Dulaglutide),  GLP1 instructions: weekly injection on Saturday,.  Last injection: 01-15-2023  Saturday SGLT-2 inhibitors / usual dose - Farxiga 10 mg daily.    SGLT-2 instructions: hold 72 hours. Last Dose: 01-22-2023  Saturday Other Diabetic medications/ instructions: Metformin 1000mg  q am - Hold DOS.  Glipizide 10mg  q am- Hold DOS Patient voiced understanding of these instructions.  Blood Thinner / Instructions: none Aspirin Instructions: none  ERAS Protocol Ordered: []  No  [x]  Yes PRE-SURGERY []  ENSURE  [x]  G2  Patient is to be NPO after: 07:15  Comments: Patient lives in [place],  [place]  Patient was given the 5 CHG shower / bath instructions for THA surgery along with 2 bottles of the CHG soap. Patient will start this on:   Saturday  Jan 22, 2023.   Patient voiced understanding of this process.   Activity level: Patient is able to climb a flight of stairs without difficulty; [x]  No CP but would have SOB.  Patient can perform ADLs without assistance.   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  Patient verbalized understanding and agreement to the Pre-Surgical Instructions that  were given to them at this PAT appointment. Patient was also educated of the need to review these PAT instructions again prior to her surgery.I reviewed the appropriate phone numbers to call if they have any and questions or concerns.

## 2023-01-14 ENCOUNTER — Encounter (HOSPITAL_COMMUNITY): Payer: Self-pay | Admitting: *Deleted

## 2023-01-14 ENCOUNTER — Encounter (HOSPITAL_COMMUNITY)
Admission: RE | Admit: 2023-01-14 | Discharge: 2023-01-14 | Disposition: A | Discharge status: Home / Self Care | Payer: BC Managed Care – PPO | Source: Ambulatory Visit | Attending: Orthopedic Surgery | Admitting: Orthopedic Surgery

## 2023-01-14 ENCOUNTER — Other Ambulatory Visit: Payer: Self-pay

## 2023-01-14 VITALS — BP 110/55 | HR 86 | Temp 98.9°F | Resp 14 | Ht 64.0 in | Wt 119.0 lb

## 2023-01-14 DIAGNOSIS — I10 Essential (primary) hypertension: Secondary | ICD-10-CM | POA: Diagnosis not present

## 2023-01-14 DIAGNOSIS — F17200 Nicotine dependence, unspecified, uncomplicated: Secondary | ICD-10-CM | POA: Insufficient documentation

## 2023-01-14 DIAGNOSIS — G8929 Other chronic pain: Secondary | ICD-10-CM

## 2023-01-14 DIAGNOSIS — K219 Gastro-esophageal reflux disease without esophagitis: Secondary | ICD-10-CM | POA: Diagnosis not present

## 2023-01-14 DIAGNOSIS — J449 Chronic obstructive pulmonary disease, unspecified: Secondary | ICD-10-CM | POA: Insufficient documentation

## 2023-01-14 DIAGNOSIS — E119 Type 2 diabetes mellitus without complications: Secondary | ICD-10-CM | POA: Diagnosis not present

## 2023-01-14 DIAGNOSIS — Z01818 Encounter for other preprocedural examination: Secondary | ICD-10-CM | POA: Insufficient documentation

## 2023-01-14 DIAGNOSIS — M1612 Unilateral primary osteoarthritis, left hip: Secondary | ICD-10-CM | POA: Insufficient documentation

## 2023-01-14 DIAGNOSIS — Z7984 Long term (current) use of oral hypoglycemic drugs: Secondary | ICD-10-CM | POA: Diagnosis not present

## 2023-01-14 DIAGNOSIS — G894 Chronic pain syndrome: Secondary | ICD-10-CM | POA: Insufficient documentation

## 2023-01-14 HISTORY — DX: Headache, unspecified: R51.9

## 2023-01-14 HISTORY — DX: Chronic obstructive pulmonary disease, unspecified: J44.9

## 2023-01-14 LAB — COMPREHENSIVE METABOLIC PANEL
ALT: 13 U/L (ref 0–44)
AST: 19 U/L (ref 15–41)
Albumin: 4.8 g/dL (ref 3.5–5.0)
Alkaline Phosphatase: 31 U/L — ABNORMAL LOW (ref 38–126)
Anion gap: 11 (ref 5–15)
BUN: 11 mg/dL (ref 8–23)
CO2: 25 mmol/L (ref 22–32)
Calcium: 9.6 mg/dL (ref 8.9–10.3)
Chloride: 101 mmol/L (ref 98–111)
Creatinine, Ser: 0.49 mg/dL (ref 0.44–1.00)
GFR, Estimated: >60 mL/min (ref >60)
Glucose, Bld: 126 mg/dL — ABNORMAL HIGH (ref 70–99)
Potassium: 4.1 mmol/L (ref 3.5–5.1)
Sodium: 137 mmol/L (ref 135–145)
Total Bilirubin: 0.3 mg/dL (ref 0.3–1.2)
Total Protein: 7.4 g/dL (ref 6.5–8.1)

## 2023-01-14 LAB — CBC WITH DIFFERENTIAL/PLATELET
Abs Immature Granulocytes: 0.03 10*3/uL (ref 0.00–0.07)
Basophils Absolute: 0.1 10*3/uL (ref 0.0–0.1)
Basophils Relative: 1 %
Eosinophils Absolute: 0.1 10*3/uL (ref 0.0–0.5)
Eosinophils Relative: 1 %
HCT: 44.4 % (ref 36.0–46.0)
Hemoglobin: 14.9 g/dL (ref 12.0–15.0)
Immature Granulocytes: 0 %
Lymphocytes Relative: 24 %
Lymphs Abs: 2.9 10*3/uL (ref 0.7–4.0)
MCH: 30.7 pg (ref 26.0–34.0)
MCHC: 33.6 g/dL (ref 30.0–36.0)
MCV: 91.5 fL (ref 80.0–100.0)
Monocytes Absolute: 1.1 10*3/uL — ABNORMAL HIGH (ref 0.1–1.0)
Monocytes Relative: 8 %
Neutro Abs: 8.3 10*3/uL — ABNORMAL HIGH (ref 1.7–7.7)
Neutrophils Relative %: 66 %
Platelets: 261 10*3/uL (ref 150–400)
RBC: 4.85 MIL/uL (ref 3.87–5.11)
RDW: 13.5 % (ref 11.5–15.5)
WBC: 12.5 10*3/uL — ABNORMAL HIGH (ref 4.0–10.5)
nRBC: 0 % (ref 0.0–0.2)

## 2023-01-14 LAB — TYPE AND SCREEN
ABO/RH(D): AB POS
Antibody Screen: NEGATIVE

## 2023-01-14 LAB — SURGICAL PCR SCREEN
MRSA, PCR: NEGATIVE
Staphylococcus aureus: NEGATIVE

## 2023-01-14 LAB — GLUCOSE, CAPILLARY: Glucose-Capillary: 131 mg/dL — ABNORMAL HIGH (ref 70–99)

## 2023-01-18 ENCOUNTER — Encounter (HOSPITAL_COMMUNITY): Payer: Self-pay

## 2023-01-18 NOTE — Progress Notes (Signed)
Case: [id number] Date/Time: 01/26/23 0932   Procedure: TOTAL HIP ARTHROPLASTY (Left: Hip)   Anesthesia type: Spinal   Pre-op diagnosis: OA LEFT HIP   Location: [facility] ROOM 08 / [facility]   Surgeons: Joen Laura, MD       DISCUSSION: Kylie Knight is a 67 year old female who presents to PAT clinic prior to surgery listed above.  Past medical history significant for diabetes, hypertension, current smoking, COPD, GERD, chronic pain syndrome on chronic narcotics with spinal cord stimulator.  No prior anesthesia complications.  Patient follows with her PCP for her diabetes and hypertension.  Her last A1c was 6.5 on 12/22/2022. Blood pressure has been controlled. Medical clearance provided in chart that patient is optimized from a medical and cardiac standpoint.   VS: BP (!) 110/55 Comment: right arm sitting  Pulse 86   Temp 37.2 C (Oral)   Resp 14   Ht 5\' 4"  (1.626 m)   Wt 54 kg   SpO2 95%   BMI 20.43 kg/m   PROVIDERS: Ignatius Specking, MD   LABS: Labs reviewed: Acceptable for surgery. (all labs ordered are listed, but only abnormal results are displayed)  Labs Reviewed  CBC WITH DIFFERENTIAL/PLATELET - Abnormal; Notable for the following components:      Result Value   WBC 12.5 (*)    Neutro Abs 8.3 (*)    Monocytes Absolute 1.1 (*)    All other components within normal limits  COMPREHENSIVE METABOLIC PANEL - Abnormal; Notable for the following components:   Glucose, Bld 126 (*)    Alkaline Phosphatase 31 (*)    All other components within normal limits  GLUCOSE, CAPILLARY - Abnormal; Notable for the following components:   Glucose-Capillary 131 (*)    All other components within normal limits  SURGICAL PCR SCREEN  TYPE AND SCREEN     IMAGES: n/a   EKG 01/14/23:  NSR   CV:  Echo 08/14/2018:  Findings: Left ventricle-the estimated LV ejection fraction is normal at 70 to 75%.  LV chamber size is normal.  LV wall thickness is mildly increased.  There are no  wall motion abnormalities observed.  There is Doppler evidence for left ventricular diastolic dysfunction. Right ventricle: RV chamber and wall dimensions are normal. Left atrium: LA chamber size is normal.  LA diameter is 3.89 cm.  The LA volume index is 21.37 mL/meters squared.  The LA volume index is within normal limits. Right atrium: RA chamber size is normal. Aortic valve: There is a trileaflet aortic valve.  There is no aortic valve stenosis or regurgitation.  The aortic valve leaflet excursion is normal Mitral valve: There is normal mitral valve structure and function.  There is no mitral valve stenosis or regurgitation. Tricuspid valve there is mild tricuspid regurgitation.  There is no tricuspid stenosis.  The estimated RV systolic pressure is 20-30.  Right atrial pressure is 5 mmHg Pulmonary valve: There is minimal pulmonic regurgitation.  There is no pulmonic stenosis. Pericardium: The pericardium is normal. Aorta: The size of the visualized portion of the aortic root is within normal limits.  They are aortic root diameter is 2.7 cm. Venous: The inferior vena cava dimension is normal.    NM Stress test 09/29/2018:  Narrative & Impression  There was no ST segment deviation noted during stress. This is a low risk study. The left ventricular ejection fraction is hyperdynamic (>65%). Small mild intensity reversible apical defect likely due to differences in adjacent gut radiotracer uptake, cannot exclude mild  apical ischemia. Either findings supports low risk      Past Medical History:  Diagnosis Date   Allergic rhinitis    Arthritis    COPD (chronic obstructive pulmonary disease) (HCC)    DDD (degenerative disc disease), cervical    Depression    GERD (gastroesophageal reflux disease)    Headache    Hyperlipidemia    Hypertension    Sciatica of left side    Tobacco abuse    Type 2 diabetes mellitus (HCC)     Past Surgical History:  Procedure Laterality Date   CARPAL  TUNNEL RELEASE Bilateral    CATARACT EXTRACTION     CESAREAN SECTION     x's 2   CHOLECYSTECTOMY     EYE SURGERY Bilateral    cataract  w/ IOL   REVISION TOTAL DISC ARTHROPLASTY, CERVICAL, SINGLE  02/2000   doesn't remember Level but was ACDF, done at [facility].   TONSILLECTOMY      MEDICATIONS:  Calcium-Magnesium-Zinc 333-133-5 MG TABS   Cholecalciferol (VITAMIN D3) 125 MCG (5000 UT) CAPS   dapagliflozin propanediol (FARXIGA) 10 MG TABS tablet   docusate sodium (COLACE) 100 MG capsule   Dulaglutide (TRULICITY) 0.75 MG/0.5ML SOPN   enalapril (VASOTEC) 5 MG tablet   Famotidine (ZANTAC 360 PO)   fenofibrate 160 MG tablet   gabapentin (NEURONTIN) 600 MG tablet   glipiZIDE (GLUCOTROL) 10 MG tablet   hydrochlorothiazide (HYDRODIURIL) 25 MG tablet   HYDROcodone-acetaminophen (NORCO) 10-325 MG tablet   Iron-FA-B Cmp-C-Biot-Probiotic (FUSION PLUS) CAPS   lansoprazole (PREVACID) 30 MG capsule   loratadine (CLARITIN) 10 MG tablet   metFORMIN (GLUCOPHAGE) 1000 MG tablet   Multiple Vitamin (MULTIVITAMIN) tablet   niacin 500 MG tablet   sertraline (ZOLOFT) 100 MG tablet   simvastatin (ZOCOR) 40 MG tablet   vitamin E 400 UNIT capsule   No current facility-administered medications for this encounter.   [name] [facility] Short Stay/Anesthesiology [facility] Phone [phone] 01/18/2023 1:42 PM

## 2023-01-18 NOTE — Anesthesia Preprocedure Evaluation (Addendum)
Anesthesia Evaluation  Patient identified by MRN, date of birth, ID band Patient awake    Reviewed: Allergy & Precautions, NPO status , Patient's Chart, lab work & pertinent test results  Airway Mallampati: II  TM Distance: >3 FB Neck ROM: Full    Dental  (+) Teeth Intact, Dental Advisory Given   Pulmonary COPD, Current Smoker and Patient abstained from smoking.   Pulmonary exam normal breath sounds clear to auscultation       Cardiovascular hypertension, Pt. on medications Normal cardiovascular exam Rhythm:Regular Rate:Normal     Neuro/Psych  Headaches PSYCHIATRIC DISORDERS  Depression     Neuromuscular disease    GI/Hepatic Neg liver ROS,GERD  Medicated,,  Endo/Other  diabetes, Type 2, Oral Hypoglycemic Agents    Renal/GU negative Renal ROS     Musculoskeletal  (+) Arthritis ,    Abdominal   Peds  Hematology negative hematology ROS (+)   Anesthesia Other Findings Day of surgery medications reviewed with the patient.  Reproductive/Obstetrics                             Anesthesia Physical Anesthesia Plan  ASA: 3  Anesthesia Plan: General   Post-op Pain Management: Tylenol PO (pre-op)*   Induction: Intravenous  PONV Risk Score and Plan: 2 and Dexamethasone and Ondansetron  Airway Management Planned: Oral ETT  Additional Equipment:   Intra-op Plan:   Post-operative Plan: Extubation in OR  Informed Consent: I have reviewed the patients History and Physical, chart, labs and discussed the procedure including the risks, benefits and alternatives for the proposed anesthesia with the patient or authorized representative who has indicated his/her understanding and acceptance.     Dental advisory given  Plan Discussed with: CRNA  Anesthesia Plan Comments: (See PAT note from 5/10 by K Gekas PA-C)        Anesthesia Quick Evaluation

## 2023-01-26 ENCOUNTER — Ambulatory Visit (HOSPITAL_COMMUNITY): Payer: BC Managed Care – PPO | Admitting: Anesthesiology

## 2023-01-26 ENCOUNTER — Ambulatory Visit (HOSPITAL_COMMUNITY): Payer: BC Managed Care – PPO

## 2023-01-26 ENCOUNTER — Observation Stay (HOSPITAL_COMMUNITY): Payer: BC Managed Care – PPO

## 2023-01-26 ENCOUNTER — Encounter (HOSPITAL_COMMUNITY)
Admission: RE | Disposition: A | Discharge status: Home / Self Care | Payer: Self-pay | Source: Home / Self Care | Attending: Orthopedic Surgery

## 2023-01-26 ENCOUNTER — Other Ambulatory Visit: Payer: Self-pay

## 2023-01-26 ENCOUNTER — Ambulatory Visit (HOSPITAL_COMMUNITY): Payer: BC Managed Care – PPO | Admitting: Medical

## 2023-01-26 ENCOUNTER — Observation Stay (HOSPITAL_COMMUNITY)
Admission: RE | Admit: 2023-01-26 | Discharge: 2023-01-27 | Disposition: A | Discharge status: Home / Self Care | Payer: BC Managed Care – PPO | Attending: Orthopedic Surgery | Admitting: Orthopedic Surgery

## 2023-01-26 ENCOUNTER — Encounter (HOSPITAL_COMMUNITY): Payer: Self-pay | Admitting: Orthopedic Surgery

## 2023-01-26 DIAGNOSIS — I10 Essential (primary) hypertension: Secondary | ICD-10-CM | POA: Diagnosis not present

## 2023-01-26 DIAGNOSIS — Z7984 Long term (current) use of oral hypoglycemic drugs: Secondary | ICD-10-CM | POA: Insufficient documentation

## 2023-01-26 DIAGNOSIS — E119 Type 2 diabetes mellitus without complications: Secondary | ICD-10-CM | POA: Diagnosis not present

## 2023-01-26 DIAGNOSIS — F17210 Nicotine dependence, cigarettes, uncomplicated: Secondary | ICD-10-CM | POA: Insufficient documentation

## 2023-01-26 DIAGNOSIS — M1612 Unilateral primary osteoarthritis, left hip: Secondary | ICD-10-CM | POA: Diagnosis present

## 2023-01-26 DIAGNOSIS — J449 Chronic obstructive pulmonary disease, unspecified: Secondary | ICD-10-CM | POA: Diagnosis not present

## 2023-01-26 DIAGNOSIS — Z96642 Presence of left artificial hip joint: Secondary | ICD-10-CM | POA: Diagnosis present

## 2023-01-26 DIAGNOSIS — Z79899 Other long term (current) drug therapy: Secondary | ICD-10-CM | POA: Insufficient documentation

## 2023-01-26 DIAGNOSIS — Z01818 Encounter for other preprocedural examination: Secondary | ICD-10-CM

## 2023-01-26 HISTORY — PX: TOTAL HIP ARTHROPLASTY: SHX124

## 2023-01-26 LAB — HEMOGLOBIN A1C
Hgb A1c MFr Bld: 6.6 % — ABNORMAL HIGH (ref 4.8–5.6)
Mean Plasma Glucose: 142.72 mg/dL

## 2023-01-26 LAB — GLUCOSE, CAPILLARY
Glucose-Capillary: 202 mg/dL — ABNORMAL HIGH (ref 70–99)
Glucose-Capillary: 191 mg/dL — ABNORMAL HIGH (ref 70–99)
Glucose-Capillary: 279 mg/dL — ABNORMAL HIGH (ref 70–99)
Glucose-Capillary: 185 mg/dL — ABNORMAL HIGH (ref 70–99)

## 2023-01-26 LAB — ABO/RH: ABO/RH(D): AB POS

## 2023-01-26 SURGERY — ARTHROPLASTY, HIP, TOTAL,POSTERIOR APPROACH
Anesthesia: General | Site: Hip | Laterality: Left

## 2023-01-26 MED ORDER — 0.9 % SODIUM CHLORIDE (POUR BTL) OPTIME
TOPICAL | Status: DC | PRN
  Administered 2023-01-26: 1000 mL

## 2023-01-26 MED ORDER — FENTANYL CITRATE PF 50 MCG/ML IJ SOSY
PREFILLED_SYRINGE | INTRAMUSCULAR | Status: AC
  Administered 2023-01-26: 50 ug via INTRAVENOUS
  Filled 2023-01-26: qty 1

## 2023-01-26 MED ORDER — ASPIRIN 81 MG PO TBEC: 81.0000 mg | DELAYED_RELEASE_TABLET | Freq: Two times a day (BID) | ORAL | 0 refills | Status: AC

## 2023-01-26 MED ORDER — ONDANSETRON HCL 4 MG/2ML IJ SOLN: 4.0000 mg | Freq: Once | INTRAMUSCULAR | Status: DC | PRN

## 2023-01-26 MED ORDER — DEXAMETHASONE SODIUM PHOSPHATE 10 MG/ML IJ SOLN
INTRAMUSCULAR | Status: AC
  Filled 2023-01-26: qty 1

## 2023-01-26 MED ORDER — LACTATED RINGERS IV SOLN: INTRAVENOUS | Status: DC

## 2023-01-26 MED ORDER — ONDANSETRON HCL 4 MG/2ML IJ SOLN
INTRAMUSCULAR | Status: DC | PRN
  Administered 2023-01-26: 4 mg via INTRAVENOUS

## 2023-01-26 MED ORDER — BUPIVACAINE LIPOSOME 1.3 % IJ SUSP: 10.0000 mL | Freq: Once | INTRAMUSCULAR | Status: DC

## 2023-01-26 MED ORDER — FENOFIBRATE 160 MG PO TABS
160.0000 mg | ORAL_TABLET | Freq: Every day | ORAL | Status: DC
  Administered 2023-01-27: 160 mg via ORAL
  Filled 2023-01-26: qty 1

## 2023-01-26 MED ORDER — POLYETHYLENE GLYCOL 3350 17 G PO PACK: 17.0000 g | PACK | Freq: Every day | ORAL | Status: DC | PRN

## 2023-01-26 MED ORDER — SIMVASTATIN 40 MG PO TABS: 40.0000 mg | ORAL_TABLET | Freq: Every day | ORAL | Status: DC

## 2023-01-26 MED ORDER — FENTANYL CITRATE (PF) 100 MCG/2ML IJ SOLN
INTRAMUSCULAR | Status: AC
  Filled 2023-01-26: qty 2

## 2023-01-26 MED ORDER — TRANEXAMIC ACID-NACL 1000-0.7 MG/100ML-% IV SOLN
1000.0000 mg | INTRAVENOUS | Status: AC
  Administered 2023-01-26: 1000 mg via INTRAVENOUS
  Filled 2023-01-26: qty 100

## 2023-01-26 MED ORDER — DIPHENHYDRAMINE HCL 12.5 MG/5ML PO ELIX: 12.5000 mg | ORAL_SOLUTION | ORAL | Status: DC | PRN

## 2023-01-26 MED ORDER — SODIUM CHLORIDE 0.9 % IV BOLUS: 500.0000 mL | Freq: Once | INTRAVENOUS | Status: DC

## 2023-01-26 MED ORDER — WATER FOR IRRIGATION, STERILE IR SOLN
Status: DC | PRN
  Administered 2023-01-26: 2000 mL

## 2023-01-26 MED ORDER — ONDANSETRON HCL 4 MG PO TABS: 4.0000 mg | ORAL_TABLET | Freq: Four times a day (QID) | ORAL | Status: DC | PRN

## 2023-01-26 MED ORDER — ALBUMIN HUMAN 5 % IV SOLN
12.5000 g | Freq: Once | INTRAVENOUS | Status: AC
  Administered 2023-01-26: 12.5 g via INTRAVENOUS

## 2023-01-26 MED ORDER — CEFAZOLIN SODIUM-DEXTROSE 2-4 GM/100ML-% IV SOLN
2.0000 g | INTRAVENOUS | Status: AC
  Administered 2023-01-26: 2 g via INTRAVENOUS
  Filled 2023-01-26: qty 100

## 2023-01-26 MED ORDER — SODIUM CHLORIDE (PF) 0.9 % IJ SOLN
INTRAMUSCULAR | Status: DC | PRN
  Administered 2023-01-26: 60 mL via INTRAVENOUS

## 2023-01-26 MED ORDER — PHENOL 1.4 % MT LIQD: 1.0000 | OROMUCOSAL | Status: DC | PRN

## 2023-01-26 MED ORDER — ACETAMINOPHEN 500 MG PO TABS
ORAL_TABLET | ORAL | Status: AC
  Filled 2023-01-26: qty 2

## 2023-01-26 MED ORDER — FENTANYL CITRATE PF 50 MCG/ML IJ SOSY
PREFILLED_SYRINGE | INTRAMUSCULAR | Status: AC
  Administered 2023-01-26: 50 ug via INTRAVENOUS
  Filled 2023-01-26: qty 2

## 2023-01-26 MED ORDER — SODIUM CHLORIDE (PF) 0.9 % IJ SOLN
INTRAMUSCULAR | Status: AC
  Filled 2023-01-26: qty 50

## 2023-01-26 MED ORDER — ACETAMINOPHEN 325 MG PO TABS: 325.0000 mg | ORAL_TABLET | Freq: Four times a day (QID) | ORAL | Status: DC | PRN

## 2023-01-26 MED ORDER — ATORVASTATIN CALCIUM 20 MG PO TABS: 20.0000 mg | ORAL_TABLET | Freq: Every evening | ORAL | Status: DC

## 2023-01-26 MED ORDER — MENTHOL 3 MG MT LOZG: 1.0000 | LOZENGE | OROMUCOSAL | Status: DC | PRN

## 2023-01-26 MED ORDER — PROPOFOL 10 MG/ML IV BOLUS
INTRAVENOUS | Status: DC | PRN
  Administered 2023-01-26: 100 mg via INTRAVENOUS
  Administered 2023-01-26: 25 ug/kg/min via INTRAVENOUS

## 2023-01-26 MED ORDER — SUGAMMADEX SODIUM 200 MG/2ML IV SOLN
INTRAVENOUS | Status: DC | PRN
  Administered 2023-01-26: 200 mg via INTRAVENOUS

## 2023-01-26 MED ORDER — HYDROCODONE-ACETAMINOPHEN 10-325 MG PO TABS
1.0000 | ORAL_TABLET | ORAL | Status: DC | PRN
  Administered 2023-01-26 – 2023-01-27 (×5): 1 via ORAL
  Filled 2023-01-26 (×7): qty 1

## 2023-01-26 MED ORDER — CEFAZOLIN SODIUM-DEXTROSE 2-4 GM/100ML-% IV SOLN
2.0000 g | Freq: Four times a day (QID) | INTRAVENOUS | Status: AC
  Administered 2023-01-26 (×2): 2 g via INTRAVENOUS
  Filled 2023-01-26 (×2): qty 100

## 2023-01-26 MED ORDER — ENALAPRIL MALEATE 2.5 MG PO TABS
5.0000 mg | ORAL_TABLET | Freq: Every day | ORAL | Status: DC
  Filled 2023-01-26: qty 2

## 2023-01-26 MED ORDER — PHENYLEPHRINE HCL-NACL 20-0.9 MG/250ML-% IV SOLN
INTRAVENOUS | Status: DC | PRN
  Administered 2023-01-26: 25 ug/min via INTRAVENOUS

## 2023-01-26 MED ORDER — GABAPENTIN 300 MG PO CAPS
600.0000 mg | ORAL_CAPSULE | Freq: Two times a day (BID) | ORAL | Status: DC
  Administered 2023-01-26 – 2023-01-27 (×2): 600 mg via ORAL
  Filled 2023-01-26 (×2): qty 2

## 2023-01-26 MED ORDER — ALBUMIN HUMAN 5 % IV SOLN
INTRAVENOUS | Status: AC
  Filled 2023-01-26: qty 250

## 2023-01-26 MED ORDER — PANTOPRAZOLE SODIUM 40 MG PO TBEC
40.0000 mg | DELAYED_RELEASE_TABLET | Freq: Every day | ORAL | Status: DC
  Administered 2023-01-26 – 2023-01-27 (×2): 40 mg via ORAL
  Filled 2023-01-26 (×2): qty 1

## 2023-01-26 MED ORDER — FENTANYL CITRATE (PF) 100 MCG/2ML IJ SOLN
INTRAMUSCULAR | Status: DC | PRN
  Administered 2023-01-26: 50 ug via INTRAVENOUS
  Administered 2023-01-26: 25 ug via INTRAVENOUS
  Administered 2023-01-26: 50 ug via INTRAVENOUS

## 2023-01-26 MED ORDER — NIACIN 500 MG PO TABS
1000.0000 mg | ORAL_TABLET | Freq: Every day | ORAL | Status: DC
  Administered 2023-01-26: 1000 mg via ORAL
  Filled 2023-01-26: qty 2

## 2023-01-26 MED ORDER — ACETAMINOPHEN 500 MG PO TABS
1000.0000 mg | ORAL_TABLET | Freq: Once | ORAL | Status: AC
  Administered 2023-01-26: 1000 mg via ORAL
  Filled 2023-01-26: qty 2

## 2023-01-26 MED ORDER — SERTRALINE HCL 100 MG PO TABS
100.0000 mg | ORAL_TABLET | Freq: Every day | ORAL | Status: DC
  Administered 2023-01-27: 100 mg via ORAL
  Filled 2023-01-26: qty 1

## 2023-01-26 MED ORDER — METHOCARBAMOL 500 MG IVPB - SIMPLE MED
INTRAVENOUS | Status: AC
  Filled 2023-01-26: qty 55

## 2023-01-26 MED ORDER — BUPIVACAINE LIPOSOME 1.3 % IJ SUSP
INTRAMUSCULAR | Status: DC | PRN
  Administered 2023-01-26: 20 mL

## 2023-01-26 MED ORDER — PHENYLEPHRINE HCL-NACL 20-0.9 MG/250ML-% IV SOLN
0.0000 ug/min | INTRAVENOUS | Status: DC
  Administered 2023-01-26: 40 ug/min via INTRAVENOUS
  Administered 2023-01-26: 45 ug/min via INTRAVENOUS
  Administered 2023-01-26: 50 ug/min via INTRAVENOUS

## 2023-01-26 MED ORDER — ALBUMIN HUMAN 5 % IV SOLN: 12.5000 g | Freq: Once | INTRAVENOUS | Status: DC

## 2023-01-26 MED ORDER — LORATADINE 10 MG PO TABS
10.0000 mg | ORAL_TABLET | Freq: Every day | ORAL | Status: DC
  Administered 2023-01-27: 10 mg via ORAL
  Filled 2023-01-26: qty 1

## 2023-01-26 MED ORDER — METHOCARBAMOL 500 MG PO TABS
500.0000 mg | ORAL_TABLET | Freq: Four times a day (QID) | ORAL | Status: DC | PRN
  Administered 2023-01-26 – 2023-01-27 (×2): 500 mg via ORAL
  Filled 2023-01-26 (×2): qty 1

## 2023-01-26 MED ORDER — CHLORHEXIDINE GLUCONATE 0.12 % MT SOLN
15.0000 mL | Freq: Once | OROMUCOSAL | Status: AC
  Administered 2023-01-26: 15 mL via OROMUCOSAL

## 2023-01-26 MED ORDER — ROCURONIUM BROMIDE 100 MG/10ML IV SOLN
INTRAVENOUS | Status: DC | PRN
  Administered 2023-01-26: 55 mg via INTRAVENOUS
  Administered 2023-01-26: 15 mg via INTRAVENOUS

## 2023-01-26 MED ORDER — DEXAMETHASONE SODIUM PHOSPHATE 10 MG/ML IJ SOLN
8.0000 mg | Freq: Once | INTRAMUSCULAR | Status: AC
  Administered 2023-01-26: 4 mg via INTRAVENOUS

## 2023-01-26 MED ORDER — GLIPIZIDE 10 MG PO TABS
10.0000 mg | ORAL_TABLET | Freq: Every day | ORAL | Status: DC
  Administered 2023-01-27: 10 mg via ORAL
  Filled 2023-01-26: qty 1

## 2023-01-26 MED ORDER — DOCUSATE SODIUM 100 MG PO CAPS
100.0000 mg | ORAL_CAPSULE | Freq: Two times a day (BID) | ORAL | Status: DC
  Administered 2023-01-26 – 2023-01-27 (×2): 100 mg via ORAL
  Filled 2023-01-26 (×2): qty 1

## 2023-01-26 MED ORDER — METFORMIN HCL 500 MG PO TABS
1000.0000 mg | ORAL_TABLET | Freq: Two times a day (BID) | ORAL | Status: DC
  Administered 2023-01-26 – 2023-01-27 (×2): 1000 mg via ORAL
  Filled 2023-01-26 (×2): qty 2

## 2023-01-26 MED ORDER — POVIDONE-IODINE 10 % EX SWAB
2.0000 | Freq: Once | CUTANEOUS | Status: AC
  Administered 2023-01-26: 2 via TOPICAL

## 2023-01-26 MED ORDER — INSULIN ASPART 100 UNIT/ML IJ SOLN
0.0000 [IU] | Freq: Three times a day (TID) | INTRAMUSCULAR | Status: DC
  Administered 2023-01-26: 8 [IU] via SUBCUTANEOUS
  Administered 2023-01-27: 3 [IU] via SUBCUTANEOUS

## 2023-01-26 MED ORDER — MIDAZOLAM HCL 5 MG/5ML IJ SOLN
INTRAMUSCULAR | Status: DC | PRN
  Administered 2023-01-26: 2 mg via INTRAVENOUS

## 2023-01-26 MED ORDER — MIDAZOLAM HCL 2 MG/2ML IJ SOLN
INTRAMUSCULAR | Status: AC
  Filled 2023-01-26: qty 2

## 2023-01-26 MED ORDER — ACETAMINOPHEN 500 MG PO TABS: 1000.0000 mg | ORAL_TABLET | Freq: Once | ORAL | Status: DC

## 2023-01-26 MED ORDER — PHENYLEPHRINE 80 MCG/ML (10ML) SYRINGE FOR IV PUSH (FOR BLOOD PRESSURE SUPPORT)
PREFILLED_SYRINGE | INTRAVENOUS | Status: DC | PRN
  Administered 2023-01-26: 160 ug via INTRAVENOUS

## 2023-01-26 MED ORDER — FENTANYL CITRATE PF 50 MCG/ML IJ SOSY
25.0000 ug | PREFILLED_SYRINGE | INTRAMUSCULAR | Status: DC | PRN
  Administered 2023-01-26: 50 ug via INTRAVENOUS

## 2023-01-26 MED ORDER — ASPIRIN 81 MG PO CHEW
81.0000 mg | CHEWABLE_TABLET | Freq: Two times a day (BID) | ORAL | Status: DC
  Administered 2023-01-26 – 2023-01-27 (×2): 81 mg via ORAL
  Filled 2023-01-26 (×2): qty 1

## 2023-01-26 MED ORDER — EPHEDRINE SULFATE-NACL 50-0.9 MG/10ML-% IV SOSY
PREFILLED_SYRINGE | INTRAVENOUS | Status: DC | PRN
  Administered 2023-01-26 (×2): 5 mg via INTRAVENOUS

## 2023-01-26 MED ORDER — METHOCARBAMOL 500 MG IVPB - SIMPLE MED
500.0000 mg | Freq: Four times a day (QID) | INTRAVENOUS | Status: DC | PRN
  Administered 2023-01-26: 500 mg via INTRAVENOUS

## 2023-01-26 MED ORDER — SODIUM CHLORIDE 0.9 % IV SOLN: INTRAVENOUS | Status: DC

## 2023-01-26 MED ORDER — ORAL CARE MOUTH RINSE: 15.0000 mL | Freq: Once | OROMUCOSAL | Status: AC

## 2023-01-26 MED ORDER — SODIUM CHLORIDE 0.9 % IR SOLN
Status: DC | PRN
  Administered 2023-01-26: 3000 mL

## 2023-01-26 MED ORDER — METHOCARBAMOL 500 MG PO TABS: 500.0000 mg | ORAL_TABLET | Freq: Three times a day (TID) | ORAL | 0 refills | Status: AC | PRN

## 2023-01-26 MED ORDER — ISOPROPYL ALCOHOL 70 % SOLN
Status: DC | PRN
  Administered 2023-01-26: 1 via TOPICAL

## 2023-01-26 MED ORDER — ONDANSETRON HCL 4 MG PO TABS: 4.0000 mg | ORAL_TABLET | Freq: Three times a day (TID) | ORAL | 0 refills | Status: AC | PRN

## 2023-01-26 MED ORDER — HYDROMORPHONE HCL 1 MG/ML IJ SOLN
0.5000 mg | INTRAMUSCULAR | Status: DC | PRN
  Administered 2023-01-26 – 2023-01-27 (×2): 1 mg via INTRAVENOUS
  Filled 2023-01-26 (×2): qty 1

## 2023-01-26 MED ORDER — DAPAGLIFLOZIN PROPANEDIOL 10 MG PO TABS
10.0000 mg | ORAL_TABLET | Freq: Every day | ORAL | Status: DC
  Administered 2023-01-27: 10 mg via ORAL
  Filled 2023-01-26: qty 1

## 2023-01-26 MED ORDER — LIDOCAINE 2% (20 MG/ML) 5 ML SYRINGE
INTRAMUSCULAR | Status: DC | PRN
  Administered 2023-01-26: 60 mg via INTRAVENOUS

## 2023-01-26 MED ORDER — HYDROCODONE-ACETAMINOPHEN 5-325 MG PO TABS: 1.0000 | ORAL_TABLET | ORAL | 0 refills | Status: AC | PRN

## 2023-01-26 MED ORDER — ONDANSETRON HCL 4 MG/2ML IJ SOLN
INTRAMUSCULAR | Status: AC
  Filled 2023-01-26: qty 2

## 2023-01-26 MED ORDER — HYDROCHLOROTHIAZIDE 25 MG PO TABS: 25.0000 mg | ORAL_TABLET | Freq: Every day | ORAL | Status: DC

## 2023-01-26 MED ORDER — PROPOFOL 1000 MG/100ML IV EMUL
INTRAVENOUS | Status: AC
  Filled 2023-01-26: qty 100

## 2023-01-26 MED ORDER — HYDROMORPHONE HCL 1 MG/ML IJ SOLN
INTRAMUSCULAR | Status: AC
  Administered 2023-01-26: 0.5 mg via INTRAVENOUS
  Filled 2023-01-26: qty 1

## 2023-01-26 MED ORDER — OXYCODONE HCL 5 MG PO TABS
ORAL_TABLET | ORAL | Status: AC
  Filled 2023-01-26: qty 1

## 2023-01-26 MED ORDER — SENNA 8.6 MG PO TABS
1.0000 | ORAL_TABLET | Freq: Two times a day (BID) | ORAL | Status: DC
  Administered 2023-01-26 – 2023-01-27 (×2): 8.6 mg via ORAL
  Filled 2023-01-26 (×2): qty 1

## 2023-01-26 MED ORDER — ONDANSETRON HCL 4 MG/2ML IJ SOLN: 4.0000 mg | Freq: Four times a day (QID) | INTRAMUSCULAR | Status: DC | PRN

## 2023-01-26 MED ORDER — ACETAMINOPHEN 500 MG PO TABS
1000.0000 mg | ORAL_TABLET | Freq: Four times a day (QID) | ORAL | Status: AC
  Administered 2023-01-26 (×2): 1000 mg via ORAL
  Filled 2023-01-26: qty 2

## 2023-01-26 MED ORDER — SODIUM CHLORIDE (PF) 0.9 % IJ SOLN
INTRAMUSCULAR | Status: AC
  Filled 2023-01-26: qty 10

## 2023-01-26 MED ORDER — BUPIVACAINE LIPOSOME 1.3 % IJ SUSP
INTRAMUSCULAR | Status: AC
  Filled 2023-01-26: qty 10

## 2023-01-26 SURGICAL SUPPLY — 72 items
ADH SKN CLS APL DERMABOND .7 (GAUZE/BANDAGES/DRESSINGS) ×1
APL PRP STRL LF DISP 70% ISPRP (MISCELLANEOUS) ×2
BAG COUNTER SPONGE SURGICOUNT (BAG) IMPLANT
BAG DECANTER FOR FLEXI CONT (MISCELLANEOUS) ×1 IMPLANT
BAG SPEC THK2 15X12 ZIP CLS (MISCELLANEOUS) ×1
BAG SPNG CNTER NS LX DISP (BAG)
BAG ZIPLOCK 12X15 (MISCELLANEOUS) ×1 IMPLANT
BLADE SAW SAG 25X90X1.19 (BLADE) ×1 IMPLANT
CHLORAPREP W/TINT 26 (MISCELLANEOUS) ×2 IMPLANT
COVER SURGICAL LIGHT HANDLE (MISCELLANEOUS) ×1 IMPLANT
DERMABOND ADVANCED .7 DNX12 (GAUZE/BANDAGES/DRESSINGS) ×1 IMPLANT
DRAPE HIP W/POCKET STRL (MISCELLANEOUS) ×1 IMPLANT
DRAPE INCISE IOBAN 66X45 STRL (DRAPES) ×1 IMPLANT
DRAPE INCISE IOBAN 85X60 (DRAPES) ×1 IMPLANT
DRAPE POUCH INSTRU U-SHP 10X18 (DRAPES) ×1 IMPLANT
DRAPE SHEET LG 3/4 BI-LAMINATE (DRAPES) ×3 IMPLANT
DRAPE SURG 17X11 SM STRL (DRAPES) ×1 IMPLANT
DRAPE U-SHAPE 47X51 STRL (DRAPES) ×2 IMPLANT
DRESSING AQUACEL AG SP 3.5X10 (GAUZE/BANDAGES/DRESSINGS) ×1 IMPLANT
DRSG AQUACEL AG ADV 3.5X10 (GAUZE/BANDAGES/DRESSINGS) IMPLANT
DRSG AQUACEL AG SP 3.5X10 (GAUZE/BANDAGES/DRESSINGS) ×1
ELECT BLADE TIP CTD 4 INCH (ELECTRODE) ×1 IMPLANT
ELECT REM PT RETURN 15FT ADLT (MISCELLANEOUS) ×1 IMPLANT
GLOVE BIO SURGEON STRL SZ 6.5 (GLOVE) ×2 IMPLANT
GLOVE BIOGEL PI IND STRL 6.5 (GLOVE) ×1 IMPLANT
GLOVE BIOGEL PI IND STRL 8 (GLOVE) ×1 IMPLANT
GLOVE SURG ORTHO 8.0 STRL STRW (GLOVE) ×2 IMPLANT
GOWN STRL REUS W/ TWL XL LVL3 (GOWN DISPOSABLE) ×2 IMPLANT
GOWN STRL REUS W/TWL XL LVL3 (GOWN DISPOSABLE) ×2
HANDPIECE INTERPULSE COAX TIP (DISPOSABLE)
HEAD CERAMIC FEMORAL 36MM (Head) IMPLANT
HOLDER FOLEY CATH W/STRAP (MISCELLANEOUS) ×1 IMPLANT
HOOD PEEL AWAY T7 (MISCELLANEOUS) ×3 IMPLANT
INSERT 0 DEGREE 36 (Miscellaneous) IMPLANT
JET LAVAGE IRRISEPT WOUND (IRRIGATION / IRRIGATOR)
KIT BASIN OR (CUSTOM PROCEDURE TRAY) ×1 IMPLANT
KIT TURNOVER KIT A (KITS) IMPLANT
LAVAGE JET IRRISEPT WOUND (IRRIGATION / IRRIGATOR) IMPLANT
MANIFOLD NEPTUNE II (INSTRUMENTS) ×1 IMPLANT
MARKER SKIN DUAL TIP RULER LAB (MISCELLANEOUS) ×1 IMPLANT
NDL HYPO 22X1.5 SAFETY MO (MISCELLANEOUS) IMPLANT
NEEDLE HYPO 22X1.5 SAFETY MO (MISCELLANEOUS) IMPLANT
NS IRRIG 1000ML POUR BTL (IV SOLUTION) ×1 IMPLANT
PACK TOTAL JOINT (CUSTOM PROCEDURE TRAY) ×1 IMPLANT
PRESSURIZER FEMORAL UNIV (MISCELLANEOUS) IMPLANT
PROTECTOR NERVE ULNAR (MISCELLANEOUS) ×1 IMPLANT
RETRIEVER SUT HEWSON (MISCELLANEOUS) ×1 IMPLANT
SCREW HEX LP 6.5X20 (Screw) IMPLANT
SCREW HEX LP 6.5X25 (Screw) IMPLANT
SEALER BIPOLAR AQUA 6.0 (INSTRUMENTS) IMPLANT
SET HNDPC FAN SPRY TIP SCT (DISPOSABLE) IMPLANT
SHELL TRIDENT II CLUST 50 (Shell) IMPLANT
SPIKE FLUID TRANSFER (MISCELLANEOUS) ×3 IMPLANT
STEM ACCOLADE II SZ5 (Stem) IMPLANT
STEM ACCOLADE II SZ6 (Stem) IMPLANT
SUCTION FRAZIER HANDLE 12FR (TUBING) ×1
SUCTION TUBE FRAZIER 12FR DISP (TUBING) ×1 IMPLANT
SUT BONE WAX W31G (SUTURE) ×1 IMPLANT
SUT ETHIBOND #5 BRAIDED 30INL (SUTURE) ×1 IMPLANT
SUT MNCRL AB 3-0 PS2 18 (SUTURE) ×1 IMPLANT
SUT STRATAFIX 0 PDS 27 VIOLET (SUTURE) ×1
SUT STRATAFIX PDO 1 14 VIOLET (SUTURE) ×1
SUT STRATFX PDO 1 14 VIOLET (SUTURE) ×1
SUT VIC AB 2-0 CT2 27 (SUTURE) ×2 IMPLANT
SUTURE STRATFX 0 PDS 27 VIOLET (SUTURE) ×1 IMPLANT
SUTURE STRATFX PDO 1 14 VIOLET (SUTURE) ×1 IMPLANT
SYR 20ML LL LF (SYRINGE) ×2 IMPLANT
TOWEL OR 17X26 10 PK STRL BLUE (TOWEL DISPOSABLE) ×1 IMPLANT
TRAY FOLEY MTR SLVR 16FR STAT (SET/KITS/TRAYS/PACK) ×1 IMPLANT
TUBE SUCTION HIGH CAP CLEAR NV (SUCTIONS) ×1 IMPLANT
UNDERPAD 30X36 HEAVY ABSORB (UNDERPADS AND DIAPERS) ×1 IMPLANT
WATER STERILE IRR 1000ML POUR (IV SOLUTION) ×2 IMPLANT

## 2023-01-26 NOTE — Plan of Care (Signed)
  Problem: Education: Goal: Knowledge of the prescribed therapeutic regimen will improve Outcome: Progressing   Problem: Activity: Goal: Ability to avoid complications of mobility impairment will improve Outcome: Progressing   Problem: Pain Management: Goal: Pain level will decrease with appropriate interventions Outcome: Progressing   Problem: Education: Goal: Ability to describe self-care measures that may prevent or decrease complications (Diabetes Survival Skills Education) will improve Outcome: Progressing   Problem: Nutritional: Goal: Maintenance of adequate nutrition will improve Outcome: Progressing   Problem: Education: Goal: Knowledge of General Education information will improve Description: Including pain rating scale, medication(s)/side effects and non-pharmacologic comfort measures Outcome: Progressing   Problem: Elimination: Goal: Will not experience complications related to bowel motility Outcome: Progressing

## 2023-01-26 NOTE — Anesthesia Postprocedure Evaluation (Signed)
Anesthesia Post Note  Patient: Kylie Knight  Procedure(s) Performed: TOTAL HIP ARTHROPLASTY (Left: Hip)     Patient location during evaluation: PACU Anesthesia Type: General Level of consciousness: awake and alert, patient cooperative and oriented Pain management: pain level controlled Vital Signs Assessment: post-procedure vital signs reviewed and stable Respiratory status: nonlabored ventilation, spontaneous breathing and respiratory function stable Cardiovascular status: blood pressure returned to baseline and stable (BP discrepancy in upper extrremity) Postop Assessment: no apparent nausea or vomiting Anesthetic complications: no   No notable events documented.  Last Vitals:  Vitals:   01/26/23 1545 01/26/23 1620  BP: 102/61 (!) 121/54  Pulse: 85 74  Resp: 14 20  Temp:  36.8 C  SpO2: 100% 97%    Last Pain:  Vitals:   01/26/23 1620  TempSrc: Oral  PainSc: 4                  Teira Arcilla,E. Esma Kilts

## 2023-01-26 NOTE — Op Note (Addendum)
01/26/2023  11:02 AM  PATIENT:  Kylie Knight   MRN: [id number]  PRE-OPERATIVE DIAGNOSIS: Severe left degenerative labral pathology, moderate hip osteoarthritis  POST-OPERATIVE DIAGNOSIS:  same  PROCEDURE:  Procedure(s): LEFT TOTAL HIP ARTHROPLASTY  PREOPERATIVE INDICATIONS:    Kylie Knight is an 67 y.o. female who has a diagnosis of Severe left degenerative labral pathology, moderate hip osteoarthritis and elected for surgical management after failing conservative treatment.  The risks benefits and alternatives were discussed with the patient including but not limited to the risks of nonoperative treatment, versus surgical intervention including infection, bleeding, nerve injury, periprosthetic fracture, the need for revision surgery, dislocation, leg length discrepancy, blood clots, cardiopulmonary complications, morbidity, mortality, among others, and they were willing to proceed.     OPERATIVE REPORT     SURGEON:  Weber Cooks, MD    ASSISTANT: Kathie Dike, PA-C, (Present throughout the entire procedure,  necessary for completion of procedure in a timely manner, assisting with retraction, instrumentation, and closure)     ANESTHESIA: General  ESTIMATED BLOOD LOSS: 300cc    COMPLICATIONS:  None.   COMPONENTS:   Stryker Trident 250 mm acetabular shell, 6.5 hex screws x 2, Accolade 2 with 132 neck angle size #6, 36+0 ceramic head Implant Name Type Inv. Item Serial No. Manufacturer Lot No. LRB No. Used Action  SHELL TRIDENT II CLUST 50 - [id number] Shell SHELL TRIDENT II CLUST 50  [employer] [id number] Left 1 Implanted  SCREW HEX LP 6.5X20 - [id number] Screw SCREW HEX LP 6.5X20  [employer] HCAD Left 1 Implanted  SCREW HEX LP 6.5X25 - [id number] Screw SCREW HEX LP 6.5X25  [employer] HMFH Left 1 Implanted  INSERT 0 DEGREE 36 - [id number] Miscellaneous INSERT 0 DEGREE 36  [employer] [id number] Left 1 Implanted  Stryker     [id number]  Left 1 Implanted  HEAD CERAMIC FEMORAL - [id number] Head HEAD CERAMIC FEMORAL  [employer] [id number] Left 1 Implanted      PROCEDURE IN DETAIL:   The patient was met in the holding area and  identified.  The appropriate hip was identified and marked at the operative site.  The patient was then transported to the OR  and  placed under anesthesia.  At that point, the patient was  placed in the lateral decubitus position with the operative side up and  secured to the operating room table  and all bony prominences padded. A subaxillary role was also placed.    The operative lower extremity was prepped from the iliac crest to the distal leg.  Sterile draping was performed.  Preoperative antibiotics, 2 gm of ancef,1 gm of Tranexamic Acid, and 8 mg of Decadron administered. Time out was performed prior to incision.      A routine posterolateral approach was utilized via sharp dissection  carried down to the subcutaneous tissue.  Gross bleeders were Bovie coagulated.  The iliotibial band was identified and incised along the length of the skin incision through the glute max fascia.  Charnley retractor was placed with care to protect the sciatic nerve posteriorly.  With the hip internally rotated, the piriformis tendon was identified and released from the femoral insertion and tagged with a #5 Ethibond.  A capsulotomy was then performed off the femoral insertion and also tagged with a #5 Ethibond.    The femoral neck was exposed, and I resected the femoral neck based on preoperative templating relative to the lesser trochanter.    I then exposed the deep acetabulum,  cleared out any tissue including the ligamentum teres.  After adequate visualization, I excised the labrum.  I then started reaming with a 46 mm reamer, first medializing to the floor of the cotyloid fossa, and then in the position of the cup aiming towards the greater sciatic notch, matching the version of the transverse acetabular  ligament and tucked under the anterior wall. I reamed up to 50 mm reamer with good bony bed preparation and a 50 mm cup was chosen.  The real cup was then impacted into place.  Appropriate version and inclination was confirmed clinically matching their bony anatomy, and also with the use of the jig.  I placed 2 screws in the posterior superior quadrant to augment fixation.  A neutral liner was placed and impacted. It was confirmed to be appropriately seated and the acetabular retractors were removed.    I then prepared the proximal femur using the box cutter, Charnley awl, and then sequentially broached starting with 0 up to a size 5.  A trial broach, neck, and head was utilized, and I reduced the hip and it was found to have excellent stability.  There was no impingement with full extension and 90 degrees external rotation.  The hip was stable at the position of sleep and with 90 degrees flexion and 90degrees of internal rotation.  Leg lengths were also clinically assessed in the lateral position and felt to be equal. Intra-Op flatplate was obtained and confirmed appropriate component positions.  Good fill of the femur with the size 5  broach.  And restoration of leg length and offset. No evidence or concern for fracture.  A final femoral prosthesis size 5 was selected. I then impacted the real femoral prosthesis into place.upon impaction and the size femoral stem continue to subside below the neck cut, was concerned about the axial stability with the stem so elected to explant it.  Worked up with a size 6 broach which had much better axial stability also excellent rotational stability.  The real size 6 stem was opened and impacted into place.  I again trialed and selected a 36+ 0mm ball. The hip was then reduced and taken through a range of motion. There was no impingement with full extension and 90 degrees external rotation.  The hip was stable at the position of sleep and with 90 degrees flexion and 90  degrees of internal rotation. Leg lengths were  again assessed and felt to be restored.  We then opened, and I impacted the real head ball into place.  The posterior capsule was then closed with #5 Ethibond.  The piriformis was repaired through the base of the abductor tendon using a [place] suture passer.  I then irrigated the hip copiously with dilute Betadine and with normal saline pulse lavage. Periarticular injection was then performed with Exparel.   We repaired the fascia #1 barbed suture, followed by 0 barbed suture for the subcutaneous fat.  Skin was closed with 2-0 Vicryl and 3-0 Monocryl.  Dermabond and Aquacel dressing were applied. The patient was then awakened and returned to PACU in stable and satisfactory condition.  Leg lengths in the supine position were assessed and felt to be clinically equal. There were no complications.  Post op recs: WB: WBAT LLE, No formal hip precautions Abx: ancef Imaging: PACU pelvis Xray Dressing: Aquacell, keep intact until follow up DVT prophylaxis: Aspirin 81BID starting POD1 Follow up: 2 weeks after surgery for a wound check with Dr. Blanchie Dessert at [facility].  Address: [address], [place], [place] [address]  Office Phone: [phone]   Weber Cooks, MD Orthopedic Surgeon

## 2023-01-26 NOTE — Evaluation (Signed)
Physical Therapy Evaluation Patient Details Name: Kylie Knight MRN: [id number] DOB: 06-16-1956 Today's Date: 01/26/2023  History of Present Illness  67 yo female presents to therapy s/p L THA posterior lateral on 01/26/2023 due to failure of conservative measures. Pt is currently LLE WBAT and no formal hip precautions. Pt PMH includes but is not limited to: cervical DDD, HTN, HDL, DM II, spinal cord stimulator and chronic back pain and tobacco abuse.  Clinical Impression    Annalicia Witzke is a 67 y.o. female POD 0 s/p L THA. Patient reports IND with mobility at baseline. Patient is now limited by functional impairments (see PT problem list below) and requires max A  for bed mobility and min A and cues for transfers. Patient was able to ambulate 5 feet with RW and min guard level of assist. Patient instructed in exercise to facilitate ROM and circulation to manage edema. Patient will benefit from continued skilled PT interventions to address impairments and progress towards PLOF. Acute PT will follow to progress mobility and stair training in preparation for safe discharge home with family support and OPPT.      Recommendations for follow up therapy are one component of a multi-disciplinary discharge planning process, led by the attending physician.  Recommendations may be updated based on patient status, additional functional criteria and insurance authorization.  Follow Up Recommendations       Assistance Recommended at Discharge Intermittent Supervision/Assistance  Patient can return home with the following  A little help with walking and/or transfers;A little help with bathing/dressing/bathroom;Assistance with cooking/housework;Assist for transportation;Help with stairs or ramp for entrance    Equipment Recommendations None recommended by PT (pt reports DME in home setting)  Recommendations for Other Services       Functional Status Assessment Patient has had a recent decline in  their functional status and demonstrates the ability to make significant improvements in function in a reasonable and predictable amount of time.     Precautions / Restrictions Precautions Precautions: Fall Restrictions Weight Bearing Restrictions: No      Mobility  Bed Mobility Overal bed mobility: Needs Assistance Bed Mobility: Supine to Sit     Supine to sit: HOB elevated, Max assist     General bed mobility comments: pt required max A for trunk, scoot to EOB with bed pad and management of LLE pt expressed fear and 12/10 pain    Transfers Overall transfer level: Needs assistance Equipment used: Rolling walker (2 wheels) Transfers: Sit to/from Stand Sit to Stand: Min assist           General transfer comment: cues for proper UE and AD placement and encouragement pt self limiting LLE WB    Ambulation/Gait Ambulation/Gait assistance: Min guard Gait Distance (Feet): 5 Feet Assistive device: Rolling walker (2 wheels) Gait Pattern/deviations: Step-to pattern, Shuffle, Antalgic, Trunk flexed, Narrow base of support Gait velocity: decreased     General Gait Details: pt fearful of placing weight on LLE and heavy reliance on B UE support at RW for offloading LLE in stance phase, strong cues for proper sequencing and occational assist to advace RW  Stairs            Wheelchair Mobility    Modified Rankin (Stroke Patients Only)       Balance Overall balance assessment: Needs assistance Sitting-balance support: Feet supported Sitting balance-Leahy Scale: Fair     Standing balance support: Bilateral upper extremity supported, During functional activity, Reliant on assistive device for balance Standing balance-Leahy Scale: Poor  Pertinent Vitals/Pain Pain Assessment Pain Assessment: 0-10 Pain Score: 10-Worst pain ever Pain Location: L hip and knee Pain Descriptors / Indicators: Aching, Constant, Crying,  Discomfort, Operative site guarding Pain Intervention(s): Limited activity within patient's tolerance, Premedicated before session, Monitored during session, Repositioned, Ice applied    Home Living Family/patient expects to be discharged to:: Private residence Living Arrangements: Spouse/significant other Available Help at Discharge: Family Type of Home: House Home Access: Ramped entrance       Home Layout: Multi-level;Able to live on main level with bedroom/bathroom Home Equipment: Rolling Walker (2 wheels);Wheelchair - manual;Cane - single point      Prior Function Prior Level of Function : Independent/Modified Independent             Mobility Comments: IND with all ADLs, self care tasks, IADLs, driving       Hand Dominance        Extremity/Trunk Assessment        Lower Extremity Assessment Lower Extremity Assessment: LLE deficits/detail LLE Deficits / Details: ankle DF 4/5, PF 5/5 LLE Sensation: WNL    Cervical / Trunk Assessment Cervical / Trunk Assessment:  (wfl)  Communication   Communication: No difficulties  Cognition Arousal/Alertness: Awake/alert Behavior During Therapy: WFL for tasks assessed/performed Overall Cognitive Status: Within Functional Limits for tasks assessed                                          General Comments      Exercises Total Joint Exercises Ankle Circles/Pumps: AROM, Both, 15 reps   Assessment/Plan    PT Assessment Patient needs continued PT services  PT Problem List Decreased strength;Decreased range of motion;Decreased activity tolerance;Decreased balance;Decreased mobility;Decreased coordination;Pain       PT Treatment Interventions DME instruction;Gait training;Functional mobility training;Therapeutic activities;Therapeutic exercise;Balance training;Neuromuscular re-education;Patient/family education;Modalities    PT Goals (Current goals can be found in the Care Plan section)  Acute Rehab PT  Goals Patient Stated Goal: to walk without pain PT Goal Formulation: With patient Time For Goal Achievement: 02/08/23 Potential to Achieve Goals: Good    Frequency 7X/week     Co-evaluation               AM-PAC PT "6 Clicks" Mobility  Outcome Measure Help needed turning from your back to your side while in a flat bed without using bedrails?: A Lot Help needed moving from lying on your back to sitting on the side of a flat bed without using bedrails?: A Lot Help needed moving to and from a bed to a chair (including a wheelchair)?: A Little Help needed standing up from a chair using your arms (e.g., wheelchair or bedside chair)?: A Little Help needed to walk in hospital room?: A Little Help needed climbing 3-5 steps with a railing? : Total 6 Click Score: 14    End of Session Equipment Utilized During Treatment: Gait belt Activity Tolerance: Patient limited by pain Patient left: in chair;with call bell/phone within reach;with chair alarm set Nurse Communication: Mobility status;Patient requests pain meds PT Visit Diagnosis: Unsteadiness on feet (R26.81);Other abnormalities of gait and mobility (R26.89);Muscle weakness (generalized) (M62.81);Pain;Difficulty in walking, not elsewhere classified (R26.2) Pain - Right/Left: Left Pain - part of body: Hip;Knee;Leg    Time: 2841-3244 PT Time Calculation (min) (ACUTE ONLY): 28 min   Charges:   PT Evaluation $PT Eval Low Complexity: 1 Low PT Treatments $Therapeutic Activity: 8-22 mins  Johnny Bridge, PT Acute Rehab   Jacqualyn Posey 01/26/2023, 6:23 PM

## 2023-01-26 NOTE — Anesthesia Procedure Notes (Signed)
Procedure Name: Intubation Date/Time: 01/26/2023 9:21 AM  Performed by: Elisabeth Cara, CRNAPre-anesthesia Checklist: Patient identified, Emergency Drugs available, Suction available, Patient being monitored and Timeout performed Patient Re-evaluated:Patient Re-evaluated prior to induction Oxygen Delivery Method: Circle system utilized Preoxygenation: Pre-oxygenation with 100% oxygen Induction Type: IV induction Ventilation: Mask ventilation without difficulty Laryngoscope Size: Mac and 4 Grade View: Grade I Tube type: Oral Tube size: 7.0 mm Number of attempts: 1 Airway Equipment and Method: Stylet Placement Confirmation: ETT inserted through vocal cords under direct vision, positive ETCO2 and breath sounds checked- equal and bilateral Secured at: 21 cm Tube secured with: Tape Dental Injury: Teeth and Oropharynx as per pre-operative assessment

## 2023-01-26 NOTE — Transfer of Care (Signed)
Immediate Anesthesia Transfer of Care Note  Patient: Kylie Knight  Procedure(s) Performed: TOTAL HIP ARTHROPLASTY (Left: Hip)  Patient Location: PACU  Anesthesia Type:General  Level of Consciousness: awake, alert , oriented, and patient cooperative  Airway & Oxygen Therapy: Patient Spontanous Breathing and Patient connected to face mask oxygen  Post-op Assessment: Report given to RN, Post -op Vital signs reviewed and stable, and Patient moving all extremities  Post vital signs: Reviewed and stable  Last Vitals:  Vitals Value Taken Time  BP 73/54 01/26/23 1130  Temp    Pulse 90 01/26/23 1130  Resp 24 01/26/23 1130  SpO2 99 % 01/26/23 1130  Vitals shown include unvalidated device data.  Last Pain:  Vitals:   01/26/23 0716  TempSrc:   PainSc: 5          Complications: No notable events documented.

## 2023-01-26 NOTE — Discharge Instructions (Signed)

## 2023-01-26 NOTE — Progress Notes (Signed)
Patient switched over to portable wound vac. Verbalized understanding of how to use/charge/trouble shoot. Box and instruction book sent home with patient

## 2023-01-26 NOTE — Progress Notes (Signed)
Dr Jean Rosenthal notified [facility]. No new orders.

## 2023-01-26 NOTE — Plan of Care (Signed)
  Problem: Coping: Goal: Level of anxiety will decrease Outcome: Progressing   Problem: Pain Managment: Goal: General experience of comfort will improve Outcome: Progressing   

## 2023-01-26 NOTE — Interval H&P Note (Signed)
The patient has been re-examined, and the chart reviewed, and there have been no interval changes to the documented history and physical.    Plan for Left total hip arthroplasty.  The operative side was examined and the patient was confirmed to have sensation to DPN, SPN, TN intact, Motor EHL, ext, flex 5/5, and DP 2+, PT 2+, No significant edema.   The risks, benefits, and alternatives have been discussed at length with patient, and the patient is willing to proceed.  Left hip marked. Consent has been signed.

## 2023-01-27 DIAGNOSIS — M1612 Unilateral primary osteoarthritis, left hip: Secondary | ICD-10-CM | POA: Diagnosis not present

## 2023-01-27 LAB — CBC
HCT: 28.1 % — ABNORMAL LOW (ref 36.0–46.0)
Hemoglobin: 9.1 g/dL — ABNORMAL LOW (ref 12.0–15.0)
MCH: 30.3 pg (ref 26.0–34.0)
MCHC: 32.4 g/dL (ref 30.0–36.0)
MCV: 93.7 fL (ref 80.0–100.0)
Platelets: 153 10*3/uL (ref 150–400)
RBC: 3 MIL/uL — ABNORMAL LOW (ref 3.87–5.11)
RDW: 13.6 % (ref 11.5–15.5)
WBC: 10.7 10*3/uL — ABNORMAL HIGH (ref 4.0–10.5)
nRBC: 0 % (ref 0.0–0.2)

## 2023-01-27 LAB — BASIC METABOLIC PANEL
Anion gap: 6 (ref 5–15)
BUN: 8 mg/dL (ref 8–23)
CO2: 27 mmol/L (ref 22–32)
Calcium: 8.4 mg/dL — ABNORMAL LOW (ref 8.9–10.3)
Chloride: 105 mmol/L (ref 98–111)
Creatinine, Ser: <0.3 mg/dL — ABNORMAL LOW (ref 0.44–1.00)
Glucose, Bld: 190 mg/dL — ABNORMAL HIGH (ref 70–99)
Potassium: 3.5 mmol/L (ref 3.5–5.1)
Sodium: 138 mmol/L (ref 135–145)

## 2023-01-27 LAB — GLUCOSE, CAPILLARY
Glucose-Capillary: 193 mg/dL — ABNORMAL HIGH (ref 70–99)
Glucose-Capillary: 148 mg/dL — ABNORMAL HIGH (ref 70–99)

## 2023-01-27 NOTE — Progress Notes (Signed)
Physical Therapy Treatment Patient Details Name: Kylie Knight MRN: [id number] DOB: 10/28/1955 Today's Date: 01/27/2023   History of Present Illness 67 yo female presents to therapy s/p L THA posterior lateral on 01/26/2023 due to failure of conservative measures. Pt is currently LLE WBAT and no formal hip precautions. Pt PMH includes but is not limited to: cervical DDD, HTN, HDL, DM II, spinal cord stimulator and chronic back pain and tobacco abuse.    PT Comments    POD # 1 pm session Assisted with amb a slightly greater distance.  General Gait Details: spouse stated they have a wheelchair and can "roll" pt into home up the ramp as pt continues to c/o fatigue. Then returned to room to perform some TE's following HEP handout.  Instructed on proper tech, freq as well as use of ICE.   Addressed all mobility questions, discussed appropriate activity, educated on use of ICE.  Pt ready for D/C to home.   Recommendations for follow up therapy are one component of a multi-disciplinary discharge planning process, led by the attending physician.  Recommendations may be updated based on patient status, additional functional criteria and insurance authorization.  Follow Up Recommendations       Assistance Recommended at Discharge Intermittent Supervision/Assistance  Patient can return home with the following A little help with walking and/or transfers;A little help with bathing/dressing/bathroom;Assistance with cooking/housework;Assist for transportation;Help with stairs or ramp for entrance   Equipment Recommendations  None recommended by PT    Recommendations for Other Services       Precautions / Restrictions Precautions Precautions: Fall Precaution Comments: none Restrictions Weight Bearing Restrictions: No LLE Weight Bearing: Weight bearing as tolerated     Mobility  Bed Mobility Overal bed mobility: Needs Assistance Bed Mobility: Supine to Sit           General bed  mobility comments: OOB in recliner    Transfers Overall transfer level: Needs assistance Equipment used: Rolling walker (2 wheels) Transfers: Sit to/from Stand Sit to Stand: Supervision, Min guard           General transfer comment: cues for proper UE and AD placement    Ambulation/Gait Ambulation/Gait assistance: Supervision, Min guard Gait Distance (Feet): 18 Feet Assistive device: Rolling walker (2 wheels) Gait Pattern/deviations: Step-to pattern, Shuffle, Antalgic, Trunk flexed, Narrow base of support Gait velocity: decreased     General Gait Details: spouse stated they have a wheelchair and can "roll" pt into home up the ramp as pt continues to c/o fatigue.   Stairs Stairs:  (RAMP)           Wheelchair Mobility    Modified Rankin (Stroke Patients Only)       Balance                                            Cognition Arousal/Alertness: Awake/alert Behavior During Therapy: WFL for tasks assessed/performed Overall Cognitive Status: Within Functional Limits for tasks assessed                                 General Comments: AxO x 3 very pleasant Lady        Exercises      General Comments        Pertinent Vitals/Pain Pain Assessment Pain Assessment: 0-10 Pain Score: 5  Pain  Location: L hip and knee    Home Living                          Prior Function            PT Goals (current goals can now be found in the care plan section) Progress towards PT goals: Progressing toward goals    Frequency    7X/week      PT Plan Current plan remains appropriate    Co-evaluation              AM-PAC PT "6 Clicks" Mobility   Outcome Measure  Help needed turning from your back to your side while in a flat bed without using bedrails?: A Lot Help needed moving from lying on your back to sitting on the side of a flat bed without using bedrails?: A Lot Help needed moving to and from a bed to  a chair (including a wheelchair)?: A Lot Help needed standing up from a chair using your arms (e.g., wheelchair or bedside chair)?: A Lot Help needed to walk in hospital room?: A Lot Help needed climbing 3-5 steps with a railing? : A Lot 6 Click Score: 12    End of Session Equipment Utilized During Treatment: Gait belt Activity Tolerance: Patient limited by pain;Patient limited by fatigue Patient left: in chair;with call bell/phone within reach;with chair alarm set Nurse Communication: Mobility status;Patient requests pain meds PT Visit Diagnosis: Unsteadiness on feet (R26.81);Other abnormalities of gait and mobility (R26.89);Muscle weakness (generalized) (M62.81);Pain;Difficulty in walking, not elsewhere classified (R26.2) Pain - Right/Left: Left Pain - part of body: Hip     Time: 1305-1330 PT Time Calculation (min) (ACUTE ONLY): 25 min  Charges:  $Gait Training: 8-22 mins $Therapeutic Activity: 8-22 mins                     Felecia Shelling  PTA Acute  Rehabilitation Services Office M-F          [phone]

## 2023-01-27 NOTE — TOC Transition Note (Signed)
Transition of Care [facility]) - CM/SW Discharge Note  Patient Details  Name: Kylie Knight MRN: [id number] Date of Birth: 1956-05-07  Transition of [facility]) CM/SW Contact:  Ewing Schlein, LCSW Phone Number: 01/27/2023, 10:44 AM  Clinical Narrative: Patient is expected to discharge home after working with PT. CSW met with patient and husband to confirm discharge plan. Patient will go home with OPPT at [facility] with the first appointment scheduled for 02/01/23. Patient has a rolling walker, cane, and wheelchair at home. Husband reported they will also buy a BSC, so there are no DME needs at this time. TOC signing off.  Final next level of care: OP Rehab Barriers to Discharge: No Barriers Identified  Patient Goals and CMS Choice Choice offered to / list presented to : NA  Discharge Plan and Services Additional resources added to the After Visit Summary for          DME Arranged: N/A DME Agency: NA  Social Determinants of Health (SDOH) Interventions SDOH Screenings   Food Insecurity: No Food Insecurity (01/26/2023)  Housing: Low Risk  (01/26/2023)  Transportation Needs: No Transportation Needs (01/26/2023)  Utilities: Not At Risk (01/26/2023)  Tobacco Use: High Risk (01/26/2023)   Readmission Risk Interventions     No data to display

## 2023-01-27 NOTE — Progress Notes (Signed)
     Subjective: Patient reports pain as moderate.  Better controlled this morning than it was last night.  Worked well with physical therapy yesterday mobilizing 5 feet.  Denies distal numbness and tingling.  Discussed plan for possible discharge home later today.  Objective:   VITALS:   Vitals:   01/26/23 1805 01/26/23 2133 01/27/23 0134 01/27/23 0521  BP: (!) 103/47 (!) 118/58 (!) 124/54 109/61  Pulse: 77 77 76 93  Resp: 17 17 15 18   Temp: 97.6 F (36.4 C) 98 F (36.7 C) 97.8 F (36.6 C) 98.5 F (36.9 C)  TempSrc: Oral Oral Oral Oral  SpO2: 96% 98% 98% 94%  Weight:      Height:        Sensation intact distally Intact pulses distally Dorsiflexion/Plantar flexion intact Incision: dressing C/D/I Compartment soft    Lab Results  Component Value Date   WBC 10.7 (H) 01/27/2023   HGB 9.1 (L) 01/27/2023   HCT 28.1 (L) 01/27/2023   MCV 93.7 01/27/2023   PLT 153 01/27/2023   BMET    Component Value Date/Time   NA 138 01/27/2023 0337   K 3.5 01/27/2023 0337   CL 105 01/27/2023 0337   CO2 27 01/27/2023 0337   GLUCOSE 190 (H) 01/27/2023 0337   BUN 8 01/27/2023 0337   CREATININE <0.30 (L) 01/27/2023 0337   CALCIUM 8.4 (L) 01/27/2023 0337   GFRNONAA NOT CALCULATED 01/27/2023 1610    Xray: Total hip arthroplasty components in good position no adverse features  Assessment/Plan: 1 Day Post-Op   Principal Problem:   S/P total left hip arthroplasty  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 Blanchie Dessert at [facility]  Address: [address], [place], [place] [address]  Office Phone: [phone]        Joen Laura 01/27/2023, 7:02 AM   Weber Cooks, MD  Contact  information:   [phone] 7am-5pm epic message Dr. Blanchie Dessert, or call office for patient follow up: [phone] After hours and holidays please check [email or web address] for group call information for [facility]

## 2023-01-27 NOTE — Discharge Summary (Signed)
Physician Discharge Summary  Patient ID: Kylie Knight MRN: [id number] DOB/AGE: 1955-11-11 67 y.o.  Admit date: 01/26/2023 Discharge date: 01/27/2023  Admission Diagnoses:  S/P total left hip arthroplasty  Discharge Diagnoses:  Principal Problem:   S/P total left hip arthroplasty   Past Medical History:  Diagnosis Date   Allergic rhinitis    Arthritis    COPD (chronic obstructive pulmonary disease) (HCC)    DDD (degenerative disc disease), cervical    Depression    GERD (gastroesophageal reflux disease)    Headache    Hyperlipidemia    Hypertension    Sciatica of left side    Tobacco abuse    Type 2 diabetes mellitus (HCC)     Surgeries: Procedure(s): TOTAL HIP ARTHROPLASTY on 01/26/2023   Consultants (if any):   Discharged Condition: Improved  Hospital Course: Kylie Knight is an 67 y.o. female who was admitted 01/26/2023 with a diagnosis of S/P total left hip arthroplasty and went to the operating room on 01/26/2023 and underwent the above named procedures.    She was given perioperative antibiotics:  Anti-infectives (From admission, onward)    Start     Dose/Rate Route Frequency Ordered Stop   01/26/23 1700  ceFAZolin (ANCEF) IVPB 2g/100 mL premix        2 g 200 mL/hr over 30 Minutes Intravenous Every 6 hours 01/26/23 1614 01/26/23 2304   01/26/23 0645  ceFAZolin (ANCEF) IVPB 2g/100 mL premix        2 g 200 mL/hr over 30 Minutes Intravenous On call to O.R. 01/26/23 8295 01/26/23 6213     .  She was given sequential compression devices, early ambulation, and aspirin for DVT prophylaxis.  She benefited maximally from the hospital stay and there were no complications.    Recent vital signs:  Vitals:   01/27/23 0134 01/27/23 0521  BP: (!) 124/54 109/61  Pulse: 76 93  Resp: 15 18  Temp: 97.8 F (36.6 C) 98.5 F (36.9 C)  SpO2: 98% 94%    Recent laboratory studies:  Lab Results  Component Value Date   HGB 9.1 (L) 01/27/2023   HGB 14.9  01/14/2023   Lab Results  Component Value Date   WBC 10.7 (H) 01/27/2023   PLT 153 01/27/2023   No results found for: "INR" Lab Results  Component Value Date   NA 138 01/27/2023   K 3.5 01/27/2023   CL 105 01/27/2023   CO2 27 01/27/2023   BUN 8 01/27/2023   CREATININE <0.30 (L) 01/27/2023   GLUCOSE 190 (H) 01/27/2023    Discharge Medications:   Allergies as of 01/27/2023   No Known Allergies      Medication List     STOP taking these medications    ZANTAC 360 PO       TAKE these medications    aspirin EC 81 MG tablet Take 1 tablet (81 mg total) by mouth 2 (two) times daily for 28 days. Swallow whole.   Calcium-Magnesium-Zinc 333-133-5 MG Tabs Take 1 tablet by mouth daily.   docusate sodium 100 MG capsule Commonly known as: COLACE Take 100 mg by mouth daily.   enalapril 5 MG tablet Commonly known as: VASOTEC Take 5 mg by mouth daily.   Farxiga 10 MG Tabs tablet Generic drug: dapagliflozin propanediol Take 10 mg by mouth daily.   fenofibrate 160 MG tablet Take 160 mg by mouth daily.   Fusion Plus Caps Take 1 capsule by mouth daily.   gabapentin 600 MG tablet Commonly  known as: NEURONTIN Take 600 mg by mouth 2 (two) times daily.   glipiZIDE 10 MG tablet Commonly known as: GLUCOTROL Take 10 mg by mouth daily before breakfast.   hydrochlorothiazide 25 MG tablet Commonly known as: HYDRODIURIL Take 25 mg by mouth daily.   HYDROcodone-acetaminophen 10-325 MG tablet Commonly known as: NORCO Take 1 tablet by mouth every 6 (six) hours as needed for moderate pain. What changed: Another medication with the same name was added. Make sure you understand how and when to take each.   HYDROcodone-acetaminophen 5-325 MG tablet Commonly known as: NORCO/VICODIN Take 1 tablet by mouth every 4 (four) hours as needed for up to 7 days for moderate pain. What changed: You were already taking a medication with the same name, and this prescription was added. Make  sure you understand how and when to take each.   lansoprazole 30 MG capsule Commonly known as: PREVACID Take 30 mg by mouth daily at 12 noon.   loratadine 10 MG tablet Commonly known as: CLARITIN Take 10 mg by mouth daily.   metFORMIN 1000 MG tablet Commonly known as: GLUCOPHAGE Take 1,000 mg by mouth 2 (two) times daily with a meal.   methocarbamol 500 MG tablet Commonly known as: ROBAXIN Take 1 tablet (500 mg total) by mouth every 8 (eight) hours as needed for up to 10 days for muscle spasms.   multivitamin tablet Take 1 tablet by mouth daily.   niacin 500 MG tablet Commonly known as: VITAMIN B3 Take 1,000 mg by mouth at bedtime.   ondansetron 4 MG tablet Commonly known as: Zofran Take 1 tablet (4 mg total) by mouth every 8 (eight) hours as needed for up to 14 days for nausea or vomiting.   sertraline 100 MG tablet Commonly known as: ZOLOFT Take 100 mg by mouth daily.   simvastatin 40 MG tablet Commonly known as: ZOCOR Take 40 mg by mouth daily.   Trulicity 0.75 MG/0.5ML Sopn Generic drug: Dulaglutide Inject 0.75 mg into the skin once a week.   Vitamin D3 125 MCG (5000 UT) Caps Take 5,000 Units by mouth daily.   vitamin E 180 MG (400 UNITS) capsule Take 400 Units by mouth daily.        Diagnostic Studies: DG HIP UNILAT W OR W/O PELVIS 2-3 VIEWS LEFT  Result Date: 01/26/2023 CLINICAL DATA:  [id number] Post-operative state 252351 EXAM: DG HIP (WITH OR WITHOUT PELVIS) 2-3V LEFT COMPARISON:  Same day radiograph FINDINGS: Postsurgical changes of left total hip arthroplasty. Normal alignment. No evidence of loosening or periprosthetic fracture. Expected soft tissue changes. Vascular calcifications. IMPRESSION: Postsurgical changes of left total hip arthroplasty. Normal alignment. No evidence of immediate hardware complication. Electronically Signed   By: Caprice Renshaw M.D.   On: 01/26/2023 12:47   DG Pelvis Portable  Result Date: 01/26/2023 CLINICAL DATA:  [id number]  Surgery, elective [id number] EXAM: PORTABLE PELVIS 1-2 VIEWS COMPARISON:  Radiograph 06/07/2022 FINDINGS: Single intraoperative image during left total hip arthroplasty. Alignment is normal. Expected soft tissue changes. IMPRESSION: Single intraoperative image during left total hip arthroplasty. Alignment is normal. Electronically Signed   By: Caprice Renshaw M.D.   On: 01/26/2023 10:40    Disposition: Discharge disposition: 01-Home or Self Care       Discharge Instructions     Call MD / Call 911   Complete by: As directed    If you experience chest pain or shortness of breath, CALL 911 and be transported to the hospital emergency room.  If you  develope a fever above 101 F, pus (white drainage) or increased drainage or redness at the wound, or calf pain, call your surgeon's office.   Call MD / Call 911   Complete by: As directed    If you experience chest pain or shortness of breath, CALL 911 and be transported to the hospital emergency room.  If you develope a fever above 101 F, pus (white drainage) or increased drainage or redness at the wound, or calf pain, call your surgeon's office.   Constipation Prevention   Complete by: As directed    Drink plenty of fluids.  Prune juice may be helpful.  You may use a stool softener, such as Colace (over the counter) 100 mg twice a day.  Use MiraLax (over the counter) for constipation as needed.   Constipation Prevention   Complete by: As directed    Drink plenty of fluids.  Prune juice may be helpful.  You may use a stool softener, such as Colace (over the counter) 100 mg twice a day.  Use MiraLax (over the counter) for constipation as needed.   Diet - low sodium heart healthy   Complete by: As directed    Diet - low sodium heart healthy   Complete by: As directed    Driving restrictions   Complete by: As directed    No driving for minimum of 2 weeks, until first post-op follow up   Follow the hip precautions as taught in Physical Therapy   Complete  by: As directed    Increase activity slowly as tolerated   Complete by: As directed    Increase activity slowly as tolerated   Complete by: As directed    Post-operative opioid taper instructions:   Complete by: As directed    POST-OPERATIVE OPIOID TAPER INSTRUCTIONS: It is important to wean off of your opioid medication as soon as possible. If you do not need pain medication after your surgery it is ok to stop day one. Opioids include: Codeine, Hydrocodone(Norco, Vicodin), Oxycodone(Percocet, oxycontin) and hydromorphone amongst others.  Long term and even short term use of opiods can cause: Increased pain response Dependence Constipation Depression Respiratory depression And more.  Withdrawal symptoms can include Flu like symptoms Nausea, vomiting And more Techniques to manage these symptoms Hydrate well Eat regular healthy meals Stay active Use relaxation techniques(deep breathing, meditating, yoga) Do Not substitute Alcohol to help with tapering If you have been on opioids for less than two weeks and do not have pain than it is ok to stop all together.  Plan to wean off of opioids This plan should start within one week post op of your joint replacement. Maintain the same interval or time between taking each dose and first decrease the dose.  Cut the total daily intake of opioids by one tablet each day Next start to increase the time between doses. The last dose that should be eliminated is the evening dose.      Post-operative opioid taper instructions:   Complete by: As directed    POST-OPERATIVE OPIOID TAPER INSTRUCTIONS: It is important to wean off of your opioid medication as soon as possible. If you do not need pain medication after your surgery it is ok to stop day one. Opioids include: Codeine, Hydrocodone(Norco, Vicodin), Oxycodone(Percocet, oxycontin) and hydromorphone amongst others.  Long term and even short term use of opiods can cause: Increased pain  response Dependence Constipation Depression Respiratory depression And more.  Withdrawal symptoms can include Flu like symptoms Nausea, vomiting  And more Techniques to manage these symptoms Hydrate well Eat regular healthy meals Stay active Use relaxation techniques(deep breathing, meditating, yoga) Do Not substitute Alcohol to help with tapering If you have been on opioids for less than two weeks and do not have pain than it is ok to stop all together.  Plan to wean off of opioids This plan should start within one week post op of your joint replacement. Maintain the same interval or time between taking each dose and first decrease the dose.  Cut the total daily intake of opioids by one tablet each day Next start to increase the time between doses. The last dose that should be eliminated is the evening dose.      TED hose   Complete by: As directed    Use stockings (TED hose) for 2 weeks on both leg(s).  Then another 2 weeks on the operative leg.  You may remove them at night for sleeping.        Follow-up Information     Joen Laura, MD Follow up in 2 week(s).   Specialty: Orthopedic Surgery Contact information: [address] [place] [address] [phone]                    Discharge Instructions      INSTRUCTIONS AFTER JOINT REPLACEMENT   Remove items at home which could result in a fall. This includes throw rugs or furniture in walking pathways ICE to the affected joint every three hours while awake for 30 minutes at a time, for at least the first 3-5 days, and then as needed for pain and swelling.  Continue to use ice for pain and swelling. You may notice swelling that will progress down to the foot and ankle.  This is normal after surgery.  Elevate your leg when you are not up walking on it.   Continue to use the breathing machine you got in the hospital (incentive spirometer) which will help keep your temperature down.  It is  common for your temperature to cycle up and down following surgery, especially at night when you are not up moving around and exerting yourself.  The breathing machine keeps your lungs expanded and your temperature down.   DIET:  As you were doing prior to hospitalization, we recommend a well-balanced diet.  DRESSING / WOUND CARE / SHOWERING  Keep the surgical dressing until follow up.  The dressing is water proof, so you can shower without any extra covering.  IF THE DRESSING FALLS OFF or the wound gets wet inside, change the dressing with sterile gauze.  Please use good hand washing techniques before changing the dressing.  Do not use any lotions or creams on the incision until instructed by your surgeon.    ACTIVITY  Increase activity slowly as tolerated, but follow the weight bearing instructions below.   No driving for 6 weeks or until further direction given by your physician.  You cannot drive while taking narcotics.  No lifting or carrying greater than 10 lbs. until further directed by your surgeon. Avoid periods of inactivity such as sitting longer than an hour when not asleep. This helps prevent blood clots.  You may return to work once you are authorized by your doctor.     WEIGHT BEARING   Weight bearing as tolerated with assist device (walker, cane, etc) as directed, use it as long as suggested by your surgeon or therapist, typically at least 4-6 weeks.  EXERCISES  Results after joint replacement surgery are often greatly improved when you follow the exercise, range of motion and muscle strengthening exercises prescribed by your doctor. Safety measures are also important to protect the joint from further injury. Any time any of these exercises cause you to have increased pain or swelling, decrease what you are doing until you are comfortable again and then slowly increase them. If you have problems or questions, call your caregiver or physical therapist for advice.    Rehabilitation is important following a joint replacement. After just a few days of immobilization, the muscles of the leg can become weakened and shrink (atrophy).  These exercises are designed to build up the tone and strength of the thigh and leg muscles and to improve motion. Often times heat used for twenty to thirty minutes before working out will loosen up your tissues and help with improving the range of motion but do not use heat for the first two weeks following surgery (sometimes heat can increase post-operative swelling).   These exercises can be done on a training (exercise) mat, on the floor, on a table or on a bed. Use whatever works the best and is most comfortable for you.    Use music or television while you are exercising so that the exercises are a pleasant break in your day. This will make your life better with the exercises acting as a break in your routine that you can look forward to.   Perform all exercises about fifteen times, three times per day or as directed.  You should exercise both the operative leg and the other leg as well.  Exercises include:   Quad Sets - Tighten up the muscle on the front of the thigh (Quad) and hold for 5-10 seconds.   Straight Leg Raises - With your knee straight (if you were given a brace, keep it on), lift the leg to 60 degrees, hold for 3 seconds, and slowly lower the leg.  Perform this exercise against resistance later as your leg gets stronger.  Leg Slides: Lying on your back, slowly slide your foot toward your buttocks, bending your knee up off the floor (only go as far as is comfortable). Then slowly slide your foot back down until your leg is flat on the floor again.  Angel Wings: Lying on your back spread your legs to the side as far apart as you can without causing discomfort.  Hamstring Strength:  Lying on your back, push your heel against the floor with your leg straight by tightening up the muscles of your buttocks.  Repeat, but this  time bend your knee to a comfortable angle, and push your heel against the floor.  You may put a pillow under the heel to make it more comfortable if necessary.   A rehabilitation program following joint replacement surgery can speed recovery and prevent re-injury in the future due to weakened muscles. Contact your doctor or a physical therapist for more information on knee rehabilitation.    CONSTIPATION  Constipation is defined medically as fewer than three stools per week and severe constipation as less than one stool per week.  Even if you have a regular bowel pattern at home, your normal regimen is likely to be disrupted due to multiple reasons following surgery.  Combination of anesthesia, postoperative narcotics, change in appetite and fluid intake all can affect your bowels.   YOU MUST use at least one of the following options; they are listed in order of  increasing strength to get the job done.  They are all available over the counter, and you may need to use some, POSSIBLY even all of these options:    Drink plenty of fluids (prune juice may be helpful) and high fiber foods Colace 100 mg by mouth twice a day  Senokot for constipation as directed and as needed Dulcolax (bisacodyl), take with full glass of water  Miralax (polyethylene glycol) once or twice a day as needed.  If you have tried all these things and are unable to have a bowel movement in the first 3-4 days after surgery call either your surgeon or your primary doctor.    If you experience loose stools or diarrhea, hold the medications until you stool forms back up.  If your symptoms do not get better within 1 week or if they get worse, check with your doctor.  If you experience "the worst abdominal pain ever" or develop nausea or vomiting, please contact the office immediately for further recommendations for treatment.   ITCHING:  If you experience itching with your medications, try taking only a single pain pill, or even  half a pain pill at a time.  You can also use Benadryl over the counter for itching or also to help with sleep.   TED HOSE STOCKINGS:  Use stockings on both legs until for at least 2 weeks or as directed by physician office. They may be removed at night for sleeping.  MEDICATIONS:  See your medication summary on the "After Visit Summary" that nursing will review with you.  You may have some home medications which will be placed on hold until you complete the course of blood thinner medication.  It is important for you to complete the blood thinner medication as prescribed.   Blood clot prevention (DVT Prophylaxis): After surgery you are at an increased risk for a blood clot. you were prescribed a blood thinner, Aspirin 81mg , to be taken twice daily for a total of 4 weeks from surgery to help reduce your risk of getting a blood clot. This will help prevent a blood clot. Signs of a pulmonary embolus (blood clot in the lungs) include sudden short of breath, feeling lightheaded or dizzy, chest pain with a deep breath, rapid pulse rapid breathing. Signs of a blood clot in your arms or legs include new unexplained swelling and cramping, warm, red or darkened skin around the painful area. Please call the office or 911 right away if these signs or symptoms develop.  PRECAUTIONS:  If you experience chest pain or shortness of breath - call 911 immediately for transfer to the hospital emergency department.   If you develop a fever greater that 101 F, purulent drainage from wound, increased redness or drainage from wound, foul odor from the wound/dressing, or calf pain - CONTACT YOUR SURGEON.                                                   FOLLOW-UP APPOINTMENTS:  If you do not already have a post-op appointment, please call the office for an appointment to be seen by your surgeon.  Guidelines for how soon to be seen are listed in your "After Visit Summary", but are typically between 2-3 weeks after  surgery.   POST-OPERATIVE OPIOID TAPER INSTRUCTIONS: It is important to wean off of your opioid medication  as soon as possible. If you do not need pain medication after your surgery it is ok to stop day one. Opioids include: Codeine, Hydrocodone(Norco, Vicodin), Oxycodone(Percocet, oxycontin) and hydromorphone amongst others.  Long term and even short term use of opiods can cause: Increased pain response Dependence Constipation Depression Respiratory depression And more.  Withdrawal symptoms can include Flu like symptoms Nausea, vomiting And more Techniques to manage these symptoms Hydrate well Eat regular healthy meals Stay active Use relaxation techniques(deep breathing, meditating, yoga) Do Not substitute Alcohol to help with tapering If you have been on opioids for less than two weeks and do not have pain than it is ok to stop all together.  Plan to wean off of opioids This plan should start within one week post op of your joint replacement. Maintain the same interval or time between taking each dose and first decrease the dose.  Cut the total daily intake of opioids by one tablet each day Next start to increase the time between doses. The last dose that should be eliminated is the evening dose.   MAKE SURE YOU:  Understand these instructions.  Get help right away if you are not doing well or get worse.    Thank you for letting us be a part of your medical care team.  It is a privilege we respect greatly.  We hope these instructions will help you stay on track for a fast and full recovery!           Signed: Doris Mcgilvery A Mailyn Steichen 01/27/2023, 7:05 AM

## 2023-01-27 NOTE — Progress Notes (Signed)
Physical Therapy Treatment Patient Details Name: Kylie Knight MRN: [id number] DOB: Oct 19, 1955 Today's Date: 01/27/2023   History of Present Illness 67 yo female presents to therapy s/p L THA posterior lateral on 01/26/2023 due to failure of conservative measures. Pt is currently LLE WBAT and no formal hip precautions. Pt PMH includes but is not limited to: cervical DDD, HTN, HDL, DM II, spinal cord stimulator and chronic back pain and tobacco abuse.    PT Comments    POD # 1 am session Assisted OOB to mab in hallway was limited by MAX c/o weakness.  Spouse present and asissted by following with recliner.  Performed a few TE's then applied ICE. Will see pt again this afternoon and prepare for D/C to home later today.    Recommendations for follow up therapy are one component of a multi-disciplinary discharge planning process, led by the attending physician.  Recommendations may be updated based on patient status, additional functional criteria and insurance authorization.  Follow Up Recommendations       Assistance Recommended at Discharge Intermittent Supervision/Assistance  Patient can return home with the following A little help with walking and/or transfers;A little help with bathing/dressing/bathroom;Assistance with cooking/housework;Assist for transportation;Help with stairs or ramp for entrance   Equipment Recommendations  None recommended by PT    Recommendations for Other Services       Precautions / Restrictions Precautions Precautions: Fall Precaution Comments: none Restrictions Weight Bearing Restrictions: No LLE Weight Bearing: Weight bearing as tolerated     Mobility  Bed Mobility Overal bed mobility: Needs Assistance Bed Mobility: Supine to Sit           General bed mobility comments: demonstarted and instructed how to use a belt to self assist LE off bed    Transfers Overall transfer level: Needs assistance Equipment used: Rolling walker (2  wheels) Transfers: Sit to/from Stand Sit to Stand: Supervision, Min guard           General transfer comment: cues for proper UE and AD placement    Ambulation/Gait Ambulation/Gait assistance: Min guard, Min assist Gait Distance (Feet): 7 Feet Assistive device: Rolling walker (2 wheels) Gait Pattern/deviations: Step-to pattern, Shuffle, Antalgic, Trunk flexed, Narrow base of support Gait velocity: decreased     General Gait Details: limited amb distance of 7 feet due to effort and increased pain.  Pt c/o MAX weakness.  "no energy"  Spouse asissted by following with recliner.   Stairs Stairs:  (RAMP)           Wheelchair Mobility    Modified Rankin (Stroke Patients Only)       Balance                                            Cognition Arousal/Alertness: Awake/alert Behavior During Therapy: WFL for tasks assessed/performed Overall Cognitive Status: Within Functional Limits for tasks assessed                                 General Comments: AxO x 3 very pleasant Lady        Exercises  10 reps AP and knee presses    General Comments        Pertinent Vitals/Pain Pain Assessment Pain Assessment: 0-10 Pain Score: 5  Pain Location: L hip and knee    Home  Living                          Prior Function            PT Goals (current goals can now be found in the care plan section) Progress towards PT goals: Progressing toward goals    Frequency    7X/week      PT Plan Current plan remains appropriate    Co-evaluation              AM-PAC PT "6 Clicks" Mobility   Outcome Measure  Help needed turning from your back to your side while in a flat bed without using bedrails?: A Lot Help needed moving from lying on your back to sitting on the side of a flat bed without using bedrails?: A Lot Help needed moving to and from a bed to a chair (including a wheelchair)?: A Lot Help needed standing up  from a chair using your arms (e.g., wheelchair or bedside chair)?: A Lot Help needed to walk in hospital room?: A Lot Help needed climbing 3-5 steps with a railing? : A Lot 6 Click Score: 12    End of Session Equipment Utilized During Treatment: Gait belt Activity Tolerance: Patient limited by pain;Patient limited by fatigue Patient left: in chair;with call bell/phone within reach;with chair alarm set Nurse Communication: Mobility status;Patient requests pain meds PT Visit Diagnosis: Unsteadiness on feet (R26.81);Other abnormalities of gait and mobility (R26.89);Muscle weakness (generalized) (M62.81);Pain;Difficulty in walking, not elsewhere classified (R26.2) Pain - Right/Left: Left Pain - part of body: Hip     Time: 7829-5621 PT Time Calculation (min) (ACUTE ONLY): 30 min  Charges:  $Gait Training: 8-22 mins $Therapeutic Exercise: 8-22 mins                     {Minervia Osso  PTA Acute  [employer] M-F          [phone]

## 2023-02-01 ENCOUNTER — Encounter (HOSPITAL_COMMUNITY): Payer: Self-pay | Admitting: Orthopedic Surgery

## 2023-11-05 DEATH — deceased
